# Patient Record
Sex: Male | Born: 1955 | Race: White | Hispanic: No | Marital: Married | Smoking: Never smoker
Health system: Southern US, Community
[De-identification: ages and names within clinical notes are randomized; demographics above are authoritative.]

## PROBLEM LIST (undated history)

## (undated) DIAGNOSIS — Z87442 Personal history of urinary calculi: Secondary | ICD-10-CM

## (undated) DIAGNOSIS — K4090 Unilateral inguinal hernia, without obstruction or gangrene, not specified as recurrent: Secondary | ICD-10-CM

## (undated) DIAGNOSIS — Z973 Presence of spectacles and contact lenses: Secondary | ICD-10-CM

## (undated) DIAGNOSIS — I10 Essential (primary) hypertension: Secondary | ICD-10-CM

## (undated) DIAGNOSIS — E785 Hyperlipidemia, unspecified: Secondary | ICD-10-CM

## (undated) DIAGNOSIS — N200 Calculus of kidney: Secondary | ICD-10-CM

## (undated) DIAGNOSIS — K429 Umbilical hernia without obstruction or gangrene: Secondary | ICD-10-CM

## (undated) HISTORY — PX: POLYPECTOMY: SHX149

## (undated) HISTORY — PX: HERNIA REPAIR: SHX51

## (undated) HISTORY — PX: CHOLECYSTECTOMY: SHX55

## (undated) HISTORY — DX: Hyperlipidemia, unspecified: E78.5

## (undated) HISTORY — PX: COLONOSCOPY: SHX174

## (undated) HISTORY — PX: LITHOTRIPSY: SUR834

## (undated) HISTORY — DX: Essential (primary) hypertension: I10

## (undated) HISTORY — PX: CYSTOSCOPY: SUR368

---

## 1998-11-06 ENCOUNTER — Encounter: Payer: Self-pay | Admitting: Urology

## 1998-11-06 ENCOUNTER — Ambulatory Visit (HOSPITAL_COMMUNITY): Admission: RE | Admit: 1998-11-06 | Discharge: 1998-11-06 | Payer: Self-pay | Admitting: Urology

## 1999-07-08 ENCOUNTER — Encounter: Payer: Self-pay | Admitting: Urology

## 1999-07-08 ENCOUNTER — Ambulatory Visit (HOSPITAL_COMMUNITY): Admission: RE | Admit: 1999-07-08 | Discharge: 1999-07-08 | Payer: Self-pay | Admitting: Urology

## 2000-11-29 ENCOUNTER — Encounter: Payer: Self-pay | Admitting: Urology

## 2000-11-29 ENCOUNTER — Encounter: Admission: RE | Admit: 2000-11-29 | Discharge: 2000-11-29 | Payer: Self-pay | Admitting: Urology

## 2000-12-08 ENCOUNTER — Encounter: Admission: RE | Admit: 2000-12-08 | Discharge: 2000-12-08 | Payer: Self-pay | Admitting: Urology

## 2000-12-08 ENCOUNTER — Encounter: Payer: Self-pay | Admitting: Urology

## 2000-12-21 ENCOUNTER — Encounter: Payer: Self-pay | Admitting: Urology

## 2000-12-21 ENCOUNTER — Encounter: Admission: RE | Admit: 2000-12-21 | Discharge: 2000-12-21 | Payer: Self-pay | Admitting: Urology

## 2004-10-29 ENCOUNTER — Encounter: Payer: Self-pay | Admitting: Cardiology

## 2004-10-29 ENCOUNTER — Emergency Department (HOSPITAL_COMMUNITY): Admission: EM | Admit: 2004-10-29 | Discharge: 2004-10-29 | Payer: Self-pay

## 2007-05-29 ENCOUNTER — Encounter: Admission: RE | Admit: 2007-05-29 | Discharge: 2007-05-29 | Payer: Self-pay | Admitting: Urology

## 2007-06-14 ENCOUNTER — Ambulatory Visit (HOSPITAL_COMMUNITY): Admission: RE | Admit: 2007-06-14 | Discharge: 2007-06-14 | Payer: Self-pay | Admitting: Urology

## 2007-10-23 ENCOUNTER — Ambulatory Visit: Payer: Self-pay | Admitting: Gastroenterology

## 2007-11-05 ENCOUNTER — Ambulatory Visit: Payer: Self-pay | Admitting: Gastroenterology

## 2007-11-05 ENCOUNTER — Encounter: Payer: Self-pay | Admitting: Gastroenterology

## 2009-11-20 ENCOUNTER — Encounter: Admission: RE | Admit: 2009-11-20 | Discharge: 2009-11-20 | Payer: Self-pay | Admitting: Specialist

## 2010-11-04 ENCOUNTER — Encounter: Payer: Self-pay | Admitting: Gastroenterology

## 2010-11-18 NOTE — Letter (Signed)
Summary: Colonoscopy Date Change Letter  [facility]  [address]   [place], [place] [address]   Phone: [phone]  Fax: [phone]      November 04, 2010 MRN: [id number]   [address] [name], [place]  [address]   Dear [name],   Previously you were recommended to have a repeat colonoscopy around this time. Your chart was recently reviewed by Dr. Jarold Motto of [facility]. Follow up colonoscopy is now recommended in January 2014. This revised recommendation is based on current, nationally recognized guidelines for colorectal cancer screening and polyp surveillance. These guidelines are endorsed by the American Cancer Society, [employer] on Colorectal Cancer as well as numerous other major medical organizations.  Please understand that our recommendation assumes that you do not have any new symptoms such as bleeding, a change in bowel habits, anemia, or significant abdominal discomfort. If you do have any concerning GI symptoms or want to discuss the guideline recommendations, please call to arrange an office visit at your earliest convenience. Otherwise we will keep you in our reminder system and contact you 1-2 months prior to the date listed above to schedule your next colonoscopy.  Thank you,   Vania Rea. Jarold Motto, M.D.  [facility] [phone]

## 2011-03-04 NOTE — H&P (Signed)
NAME:  Austin Lane, Austin Lane NO.:  [id number]   MEDICAL RECORD NO.:  [id number]          PATIENT TYPE:  EMS   LOCATION:  MAJO                         FACILITY:  [facility]   PHYSICIAN:  Peter M. Swaziland, M.D.  DATE OF BIRTH:  1956/05/19   DATE OF ADMISSION:  10/29/2004  DATE OF DISCHARGE:                                HISTORY & PHYSICAL   HISTORY OF PRESENT ILLNESS:  [name] is a pleasant 55 year old white male  who presents to the emergency room for an evaluation of chest pain.  Pain  has been present for 24 hours.  It is a constant pain.  It is localized  beneath his left pectoral region.  It is described as a sharp knife-like  sensation and is worse with deep breathing or certain movement.  There is no  radiation of the pain.  He has had no nausea, vomiting, shortness of breath  or diaphoresis.  The pain seemed to intensify earlier today and he presented  to the emergency room.  He has no known history of cardiac disease.  He has  no known cardiac risk factors.   PAST MEDICAL HISTORY:  His past medical history is significant for recurrent  kidney stones.  He has had multiple lithotripsy procedures.  Her has a  history of prior cholecystectomy and previous hernia repair.  He has no  known history of diabetes, hypertension or hypercholesterolemia.   CURRENT MEDICATIONS:  His current medications include hydrochlorothiazide  daily and Urocit daily, both for kidney stones.   ALLERGIES:  He has no known allergies.   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  He is married.  He has 2 children.  He denies alcohol or tobacco use.   FAMILY HISTORY:  He has a brother who has a history of dilated nonischemic  cardiomyopathy and has undergone cardiac transplant surgery.  Otherwise, his  family is negative.   PHYSICAL EXAM:  GENERAL:  The patient is a young white male in no apparent  distress.  VITAL SIGNS:  His blood pressure is 125/64, his  pulse is 77 and regular, his  respirations are 16, he is afebrile, SATS are 98% on room air.  HEENT:  He is normocephalic, atraumatic.  Pupils are equal, round and  reactive to light and accommodation.  Extraocular movements are full.  Oropharynx is clear.  NECK:  Neck is supple without JVD, adenopathy, thyromegaly or bruits.  LUNGS:  Lungs are clear to auscultation and percussion.  CARDIAC:  Exam reveals a regular rate and rhythm with normal S1 and S2,  without gallop, murmur, rub or click.  There is no localized tenderness to  palpation of the chest wall.  ABDOMEN:  The abdomen is soft and nontender, without masses or bruits.  EXTREMITIES:  Femoral and pedal pulses are 2+ and symmetric.  NEUROLOGIC:  Exam is nonfocal.   LABORATORY DATA:  Chest x-ray shows no active disease.  Heart size is  normal.   ECG shows normal sinus rhythm.  There is T wave inversion in V4 through V6  which is new compared to prior ECG in 2000.   Chemistry panel:  Sodium is 138, potassium 3.5, chloride 102, CO2 31, BUN  13, creatinine 1.0, glucose 121. Total bilirubin is 1.9; all other liver  function studies are normal.  CBC is normal.  Pro time is normal.  Troponin  is less than 0.05.  CK-MB is less than 1 and myoglobin is normal.   IMPRESSION:  1.  Atypical chest pain most consistent with a pleuritic-type pain.  2.  T-wave inversion noted on ECG.  3.  History of kidney stones.  4.  Family history of dilated cardiomyopathy.   PLAN:  We will obtain echocardiogram today.  If his echo is normal, then we  will plan on discharge today with therapy with ibuprofen.  We will arrange a  followup stress Cardiolite study as an outpatient.       PMJ/MEDQ  D:  10/29/2004  T:  10/29/2004  Job:  [id number]   cc:   Evelena Peat, M.D.  [address]  [place] [address]  Fax: [phone]

## 2011-07-29 LAB — BASIC METABOLIC PANEL
BUN: 11
Chloride: 106
Glucose, Bld: 99
Potassium: 4.1
Sodium: 140

## 2012-09-21 ENCOUNTER — Encounter: Payer: Self-pay | Admitting: Gastroenterology

## 2012-10-03 ENCOUNTER — Encounter: Payer: Self-pay | Admitting: Gastroenterology

## 2012-11-18 ENCOUNTER — Emergency Department (HOSPITAL_COMMUNITY)
Admission: EM | Admit: 2012-11-18 | Discharge: 2012-11-19 | Disposition: A | Discharge status: Home / Self Care | Payer: 59 | Attending: Emergency Medicine | Admitting: Emergency Medicine

## 2012-11-18 ENCOUNTER — Encounter (HOSPITAL_COMMUNITY): Payer: Self-pay | Admitting: Emergency Medicine

## 2012-11-18 DIAGNOSIS — A498 Other bacterial infections of unspecified site: Secondary | ICD-10-CM | POA: Insufficient documentation

## 2012-11-18 DIAGNOSIS — Z79899 Other long term (current) drug therapy: Secondary | ICD-10-CM | POA: Insufficient documentation

## 2012-11-18 DIAGNOSIS — N23 Unspecified renal colic: Secondary | ICD-10-CM

## 2012-11-18 DIAGNOSIS — B952 Enterococcus as the cause of diseases classified elsewhere: Secondary | ICD-10-CM | POA: Insufficient documentation

## 2012-11-18 DIAGNOSIS — Z87442 Personal history of urinary calculi: Secondary | ICD-10-CM | POA: Insufficient documentation

## 2012-11-18 DIAGNOSIS — Z7982 Long term (current) use of aspirin: Secondary | ICD-10-CM | POA: Insufficient documentation

## 2012-11-18 DIAGNOSIS — N201 Calculus of ureter: Secondary | ICD-10-CM | POA: Insufficient documentation

## 2012-11-18 HISTORY — DX: Calculus of kidney: N20.0

## 2012-11-18 NOTE — ED Notes (Signed)
Pt c/o RLQ pain, onset suddenly at 2000 tonight, + nausea, vomiting, diarrhea.

## 2012-11-19 ENCOUNTER — Encounter (HOSPITAL_COMMUNITY): Payer: Self-pay | Admitting: Emergency Medicine

## 2012-11-19 ENCOUNTER — Emergency Department (HOSPITAL_COMMUNITY): Payer: 59

## 2012-11-19 LAB — URINALYSIS, ROUTINE W REFLEX MICROSCOPIC
Glucose, UA: NEGATIVE mg/dL
Ketones, ur: 15 mg/dL — AB
Specific Gravity, Urine: 1.019 (ref 1.005–1.030)
pH: 8 (ref 5.0–8.0)

## 2012-11-19 LAB — URINE MICROSCOPIC-ADD ON

## 2012-11-19 MED ORDER — SODIUM CHLORIDE 0.9 % IV SOLN
INTRAVENOUS | Status: DC
  Administered 2012-11-19: 1000 mL via INTRAVENOUS

## 2012-11-19 MED ORDER — MORPHINE SULFATE 4 MG/ML IJ SOLN
4.0000 mg | INTRAMUSCULAR | Status: DC | PRN
  Administered 2012-11-19: 4 mg via INTRAVENOUS
  Filled 2012-11-19: qty 1

## 2012-11-19 MED ORDER — OXYCODONE-ACETAMINOPHEN 5-325 MG PO TABS
2.0000 | ORAL_TABLET | Freq: Once | ORAL | Status: AC
  Administered 2012-11-19: 2 via ORAL
  Filled 2012-11-19: qty 2

## 2012-11-19 MED ORDER — FENTANYL CITRATE 0.05 MG/ML IJ SOLN
50.0000 ug | Freq: Once | INTRAMUSCULAR | Status: AC
  Administered 2012-11-19: 50 ug via INTRAVENOUS
  Filled 2012-11-19: qty 2

## 2012-11-19 MED ORDER — ONDANSETRON 8 MG PO TBDP
8.0000 mg | ORAL_TABLET | Freq: Once | ORAL | Status: AC
  Administered 2012-11-19: 8 mg via ORAL
  Filled 2012-11-19: qty 1

## 2012-11-19 MED ORDER — ONDANSETRON HCL 4 MG/2ML IJ SOLN
4.0000 mg | INTRAMUSCULAR | Status: DC | PRN
  Administered 2012-11-19: 4 mg via INTRAVENOUS
  Filled 2012-11-19: qty 2

## 2012-11-19 MED ORDER — ONDANSETRON 4 MG PO TBDP: 4.0000 mg | ORAL_TABLET | Freq: Three times a day (TID) | ORAL | Status: DC | PRN

## 2012-11-19 MED ORDER — TAMSULOSIN HCL 0.4 MG PO CAPS: 0.4000 mg | ORAL_CAPSULE | Freq: Every day | ORAL | Status: DC

## 2012-11-19 MED ORDER — OXYCODONE-ACETAMINOPHEN 5-325 MG PO TABS: ORAL_TABLET | ORAL | Status: DC

## 2012-11-19 NOTE — ED Provider Notes (Signed)
History     CSN: [id number]  Arrival date & time 11/18/12  2313   First MD Initiated Contact with Patient 11/19/12 0013      Chief Complaint  Patient presents with  . Abdominal Pain  . Flank Pain     HPI Pt was seen at 0050.  Per pt, c/o sudden onset and persistence of constant right sided flank "pain" that began last evening approx 2000 PTA.  Pt describes the pain as "like my last kidney stone," and radiating into the right side of his abd. Describes his right sided lower back and abd pain as "sore." Has been associated with multiple intermittent episodes of N/V/D and hematuria.  Denies testicular pain/swelling, no dysuria, no black or blood in emesis, no CP/SOB.     Past Medical History  Diagnosis Date  . Kidney stone     Past Surgical History  Procedure Date  . Hernia repair   . Cholecystectomy      History  Substance Use Topics  . Smoking status: Never Smoker   . Smokeless tobacco: Not on file  . Alcohol Use: Yes     Comment: occasional      Review of Systems ROS: Statement: All systems negative except as marked or noted in the HPI; Constitutional: Negative for fever and chills. ; ; Eyes: Negative for eye pain, redness and discharge. ; ; ENMT: Negative for ear pain, hoarseness, nasal congestion, sinus pressure and sore throat. ; ; Cardiovascular: Negative for chest pain, palpitations, diaphoresis, dyspnea and peripheral edema. ; ; Respiratory: Negative for cough, wheezing and stridor. ; ; Gastrointestinal: +N/V/D. Negative for blood in stool, hematemesis, jaundice and rectal bleeding. . ; ; Genitourinary: Negative for dysuria, +flank pain and hematuria. ; ; Genital:  No penile drainage or rash, no testicular pain or swelling, no scrotal rash or swelling.;; Musculoskeletal: Negative for back pain and neck pain. Negative for swelling and trauma.; ; Skin: Negative for pruritus, rash, abrasions, blisters, bruising and skin lesion.; ; Neuro: Negative for headache,  lightheadedness and neck stiffness. Negative for weakness, altered level of consciousness , altered mental status, extremity weakness, paresthesias, involuntary movement, seizure and syncope.     Allergies  Review of patient's allergies indicates no known allergies.  Home Medications   Current Outpatient Rx  Name  Route  Sig  Dispense  Refill  . ASPIRIN EC 81 MG PO TBEC   Oral   Take 81 mg by mouth daily.         Marland Kitchen GABAPENTIN 600 MG PO TABS   Oral   Take 600 mg by mouth 3 (three) times daily. headaches         . OLMESARTAN MEDOXOMIL-HCTZ 40-25 MG PO TABS   Oral   Take 1 tablet by mouth daily.         . OMEGA-3-ACID ETHYL ESTERS 1 G PO CAPS   Oral   Take 1 g by mouth 2 (two) times daily. OTC         . POTASSIUM CITRATE ER 10 MEQ (1080 MG) PO TBCR   Oral   Take 10 mEq by mouth 3 (three) times daily with meals. 540mg            BP 122/83  Pulse 69  Temp 98.5 F (36.9 C) (Oral)  Resp 20  Ht 6' (1.829 m)  Wt 200 lb (90.719 kg)  BMI 27.12 kg/m2  SpO2 99%  Physical Exam 0055: Physical examination:  Nursing notes reviewed; Vital signs and O2 SAT  reviewed;  Constitutional: Well developed, Well nourished, Well hydrated, Uncomfortable appearing; Head:  Normocephalic, atraumatic; Eyes: EOMI, PERRL, No scleral icterus; ENMT: Mouth and pharynx normal, Mucous membranes moist; Neck: Supple, Full range of motion, No lymphadenopathy; Cardiovascular: Regular rate and rhythm, No gallop; Respiratory: Breath sounds clear & equal bilaterally, No rales, rhonchi, wheezes.  Speaking full sentences with ease, Normal respiratory effort/excursion; Chest: Nontender, Movement normal; Abdomen: Soft, Nontender, Nondistended, Normal bowel sounds; Genitourinary: No CVA tenderness; Spine:  No midline CS, TS, LS tenderness.  +TTP right lumbar paraspinal muscles;; Extremities: Pulses normal, No tenderness, No edema, No calf edema or asymmetry.; Neuro: AA&Ox3, Major CN grossly intact.  Speech clear. No  gross focal motor or sensory deficits in extremities.; Skin: Color normal, Warm, Dry.   ED Course  Procedures    MDM  MDM Reviewed: previous chart, nursing note and vitals Interpretation: labs and CT scan   Results for orders placed during the hospital encounter of 11/18/12  URINALYSIS, ROUTINE W REFLEX MICROSCOPIC      Component Value Range   Color, Urine RED (*) YELLOW   APPearance TURBID (*) CLEAR   Specific Gravity, Urine 1.019  1.005 - 1.030   pH 8.0  5.0 - 8.0   Glucose, UA NEGATIVE  NEGATIVE mg/dL   Hgb urine dipstick LARGE (*) NEGATIVE   Bilirubin Urine NEGATIVE  NEGATIVE   Ketones, ur 15 (*) NEGATIVE mg/dL   Protein, ur 30 (*) NEGATIVE mg/dL   Urobilinogen, UA 0.2  0.0 - 1.0 mg/dL   Nitrite NEGATIVE  NEGATIVE   Leukocytes, UA SMALL (*) NEGATIVE  URINE MICROSCOPIC-ADD ON      Component Value Range   Squamous Epithelial / LPF RARE  RARE   WBC, UA 0-2  <3 WBC/hpf   RBC / HPF TOO NUMEROUS TO COUNT  <3 RBC/hpf   Bacteria, UA FEW (*) RARE   Casts HYALINE CASTS (*) NEGATIVE   Ct Abdomen Pelvis Wo Contrast 11/19/2012  *RADIOLOGY REPORT*  Clinical Data: Right flank pain, nausea, vomiting, and diarrhea.  CT ABDOMEN AND PELVIS WITHOUT CONTRAST  Technique:  Multidetector CT imaging of the abdomen and pelvis was performed following the standard protocol without intravenous contrast.  Comparison: 05/29/2007  Findings: Atelectasis in the lung bases.  Small esophageal hiatal hernia.  5 mm stone in the distal right ureter at the level of the lower sacrum there is proximal ureterectasis and pyelocaliectasis with mild periureteral stranding.  Changes are consistent with moderate obstruction. Multiple tiny punctate sized intrarenal stones on the right.  There are multiple stones in the mid and lower pole of the left kidney, largest measuring about 4 mm.  There is no evidence of ureteral stone or obstruction on the left.  There is a small low attenuation lesion in the lower pole of the left  kidney which may represent a cyst, but it is measuring about 14 mm today compared with 7 mm previously.  Ultrasound or contrast enhanced CT is recommend to confirm cystic nature of this lesion.  The unenhanced appearance of the liver, spleen, pancreas, adrenal glands, abdominal aorta, and retroperitoneal lymph nodes is unremarkable.  Surgical absence of the gallbladder.  No bile duct dilatation.  The stomach, small bowel, and colon are not abnormally distended.  No free air or free fluid in the abdomen.  Fat in the umbilicus.  Pelvis:  Enlarged prostate gland measuring 5 x 3.9 cm.  No bladder wall thickening.  No free or loculated pelvic fluid collections. No significant pelvic lymphadenopathy.  No evidence of diverticulitis.  The appendix is normal.  Normal alignment of the lumbar vertebrae.  IMPRESSION: 5 mm stone in the distal right ureter with moderate obstruction. Multiple nonobstructing intrarenal stones. Indeterminate low attenuation lesion in the lower pole of the left kidney may represent a cyst but is enlarging since previous study.  Ultrasound or contrast enhanced CT is recommended confirm cystic nature of this lesion.  Small esophageal hiatal hernia.   Original Report Authenticated By: Burman Nieves, M.D.      0300:  Feels better now and wants to go home.  No clear UTI on Udip, UC pending.  Dx and testing d/w pt and family.  Questions answered.  Verb understanding, agreeable to d/c home with outpt f/u with his Uro MD this week.           Laray Anger, DO 11/21/12 [phone]

## 2012-11-22 ENCOUNTER — Encounter: Payer: Self-pay | Admitting: Gastroenterology

## 2012-11-22 ENCOUNTER — Ambulatory Visit (AMBULATORY_SURGERY_CENTER): Payer: 59 | Admitting: *Deleted

## 2012-11-22 VITALS — Ht 72.0 in | Wt 204.2 lb

## 2012-11-22 DIAGNOSIS — Z1211 Encounter for screening for malignant neoplasm of colon: Secondary | ICD-10-CM

## 2012-11-22 LAB — URINE CULTURE

## 2012-11-22 MED ORDER — MOVIPREP 100 G PO SOLR: ORAL | Status: DC

## 2012-11-23 NOTE — ED Notes (Signed)
+   Urine Chart sent to EDP office for review. 

## 2012-11-26 NOTE — ED Notes (Addendum)
Chart returned from ED.Rx written for Kelfex 500 tid TID x 10 days QTY 30 tabs   Written by Vedia Pereyra

## 2012-11-28 ENCOUNTER — Telehealth (HOSPITAL_COMMUNITY): Payer: Self-pay | Admitting: Emergency Medicine

## 2012-11-28 NOTE — ED Notes (Addendum)
Pt returning call.  Pt informed of Dx and need for addl tx.  Rx for Keflex called to [facility] [phone] and given to pharmacist.  Results to be faxed to [facility] Attn Dr Normajean Baxter per pt request

## 2012-12-01 ENCOUNTER — Other Ambulatory Visit: Payer: Self-pay

## 2012-12-03 ENCOUNTER — Ambulatory Visit (AMBULATORY_SURGERY_CENTER): Payer: 59 | Admitting: Gastroenterology

## 2012-12-03 ENCOUNTER — Encounter: Payer: Self-pay | Admitting: Gastroenterology

## 2012-12-03 VITALS — BP 107/75 | HR 56 | Temp 96.0°F | Resp 54 | Ht 72.0 in | Wt 204.0 lb

## 2012-12-03 DIAGNOSIS — D126 Benign neoplasm of colon, unspecified: Secondary | ICD-10-CM

## 2012-12-03 DIAGNOSIS — Z1211 Encounter for screening for malignant neoplasm of colon: Secondary | ICD-10-CM

## 2012-12-03 MED ORDER — SODIUM CHLORIDE 0.9 % IV SOLN: 500.0000 mL | INTRAVENOUS | Status: DC

## 2012-12-03 NOTE — Op Note (Signed)
[facility] [address] [place], [address]   COLONOSCOPY PROCEDURE REPORT  PATIENT: Austin Lane, Austin Lane  MR#: [id number] BIRTHDATE: [birth year] , 56  yrs. old GENDER: Male ENDOSCOPIST: Mardella Layman, MD, [facility] REFERRED BY: PROCEDURE DATE:  12/03/2012 PROCEDURE:   Colonoscopy with biopsy and snare polypectomy ASA CLASS:   Class II INDICATIONS:Patient's personal history of colon polyps and cecal adenoma removed 5y ago. MEDICATIONS: propofol (Diprivan) 200mg  IV  DESCRIPTION OF PROCEDURE:   After the risks and benefits and of the procedure were explained, informed consent was obtained.  A digital rectal exam revealed no abnormalities of the rectum.    The LB CF-[id number]  endoscope was introduced through the anus and advanced to the cecum, which was identified by both the appendix and ileocecal valve .  The quality of the prep was excellent, using MoviPrep .  The instrument was then slowly withdrawn as the colon was fully examined.     COLON FINDINGS: A small smooth flat polyp was found in the rectum. A polypectomy was performed with a cold snare.  The resection was complete and the polyp tissue was completely retrieved.   The colon was otherwise normal.  There was no diverticulosis, inflammation, polyps or cancers unless previously stated.     Retroflexed views revealed no abnormalities.     The scope was then withdrawn from the patient and the procedure completed.  COMPLICATIONS: There were no complications. ENDOSCOPIC IMPRESSION: 1.   Small flat polyp was found in the rectum; polypectomy was performed with a cold snare 2.   The colon was otherwise normal  RECOMMENDATIONS: 1.  Await biopsy results 2.  Repeat Colonoscopy in 5 years.   REPEAT EXAM:  [id number] Doristine Counter, MD  _______________________________ eSigned:  Mardella Layman, MD, [facility] 12/03/2012 8:49 AM

## 2012-12-03 NOTE — Patient Instructions (Addendum)
YOU HAD AN ENDOSCOPIC PROCEDURE TODAY AT THE [facility]: Refer to the procedure report that was given to you for any specific questions about what was found during the examination.  If the procedure report does not answer your questions, please call your gastroenterologist to clarify.  If you requested that your care partner not be given the details of your procedure findings, then the procedure report has been included in a sealed envelope for you to review at your convenience later.  YOU SHOULD EXPECT: Some feelings of bloating in the abdomen. Passage of more gas than usual.  Walking can help get rid of the air that was put into your GI tract during the procedure and reduce the bloating. If you had a lower endoscopy (such as a colonoscopy or flexible sigmoidoscopy) you may notice spotting of blood in your stool or on the toilet paper. If you underwent a bowel prep for your procedure, then you may not have a normal bowel movement for a few days.  DIET: Your first meal following the procedure should be a light meal and then it is ok to progress to your normal diet.  A half-sandwich or bowl of soup is an example of a good first meal.  Heavy or fried foods are harder to digest and may make you feel nauseous or bloated.  Likewise meals heavy in dairy and vegetables can cause extra gas to form and this can also increase the bloating.  Drink plenty of fluids but you should avoid alcoholic beverages for 24 hours.  ACTIVITY: Your care partner should take you home directly after the procedure.  You should plan to take it easy, moving slowly for the rest of the day.  You can resume normal activity the day after the procedure however you should NOT DRIVE or use heavy machinery for 24 hours (because of the sedation medicines used during the test).    SYMPTOMS TO REPORT IMMEDIATELY: A gastroenterologist can be reached at any hour.  During normal business hours, 8:30 AM to 5:00 PM Monday through Friday,  call [phone].  After hours and on weekends, please call the GI answering service at [phone] who will take a message and have the physician on call contact you.   Following lower endoscopy (colonoscopy or flexible sigmoidoscopy):  Excessive amounts of blood in the stool  Significant tenderness or worsening of abdominal pains  Swelling of the abdomen that is new, acute  Fever of 100F or higher    FOLLOW UP: If any biopsies were taken you will be contacted by phone or by letter within the next 1-3 weeks.  Call your gastroenterologist if you have not heard about the biopsies in 3 weeks.  Our staff will call the home number listed on your records the next business day following your procedure to check on you and address any questions or concerns that you may have at that time regarding the information given to you following your procedure. This is a courtesy call and so if there is no answer at the home number and we have not heard from you through the emergency physician on call, we will assume that you have returned to your regular daily activities without incident.  SIGNATURES/CONFIDENTIALITY: You and/or your care partner have signed paperwork which will be entered into your electronic medical record.  These signatures attest to the fact that that the information above on your After Visit Summary has been reviewed and is understood.  Full responsibility of the confidentiality   of this discharge information lies with you and/or your care-partner.  Polyp and hemorhoid information given.  Repeat colonoscopy in 5 years-2019

## 2012-12-03 NOTE — Progress Notes (Signed)
Called to room to assist during endoscopic procedure.  Patient ID and intended procedure confirmed with present staff. Received instructions for my participation in the procedure from the performing physician.  

## 2012-12-03 NOTE — Progress Notes (Signed)
No egg or soy allergy. No problems with sedation. ewm

## 2012-12-03 NOTE — Progress Notes (Signed)
Patient did not experience any of the following events: a burn prior to discharge; a fall within the facility; wrong site/side/patient/procedure/implant event; or a hospital transfer or hospital admission upon discharge from the facility. (G8907) Patient did not have preoperative order for IV antibiotic SSI prophylaxis. (G8918)  

## 2012-12-04 ENCOUNTER — Telehealth: Payer: Self-pay | Admitting: *Deleted

## 2012-12-04 NOTE — Telephone Encounter (Signed)
  Follow up Call-  Call back number 12/03/2012  Post procedure Call Back phone  # [phone]  Permission to leave phone message Yes     Patient questions:  Do you have a fever, pain , or abdominal swelling? no Pain Score  0 *  Have you tolerated food without any problems? yes  Have you been able to return to your normal activities? yes  Do you have any questions about your discharge instructions: Diet   no Medications  no Follow up visit  no  Do you have questions or concerns about your Care? no  Actions: * If pain score is 4 or above: No action needed, pain <4.

## 2012-12-07 ENCOUNTER — Encounter: Payer: Self-pay | Admitting: Gastroenterology

## 2013-08-22 ENCOUNTER — Other Ambulatory Visit: Payer: Self-pay

## 2014-06-06 IMAGING — CT CT ABD-PELV W/O CM
1 series · 14 of 20 positions shown, 19 images · non-contrast
Comparison: 05/29/2007

CLINICAL DATA: Right flank pain, nausea, vomiting, and diarrhea.

CT ABDOMEN AND PELVIS WITHOUT CONTRAST
TECHNIQUE: Multidetector CT imaging of the abdomen and pelvis was
performed following the standard protocol without intravenous
contrast.

[Series 3: lung · axial · 0.74mm/px · z∈[+1542,+1626]mm · 14 of 20 slices shown, 19 images]
[im 2/20  soft-tissue]
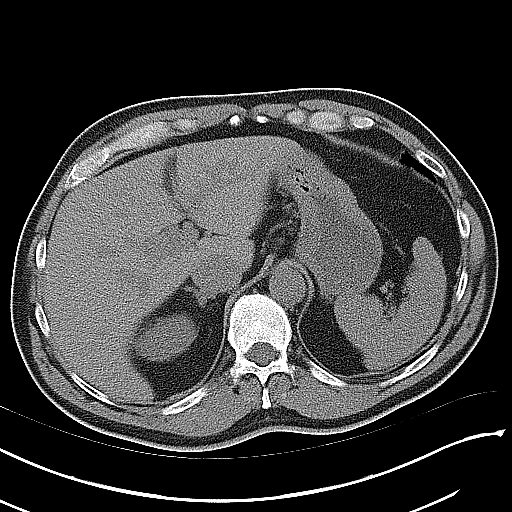
[im 2/20  bone]
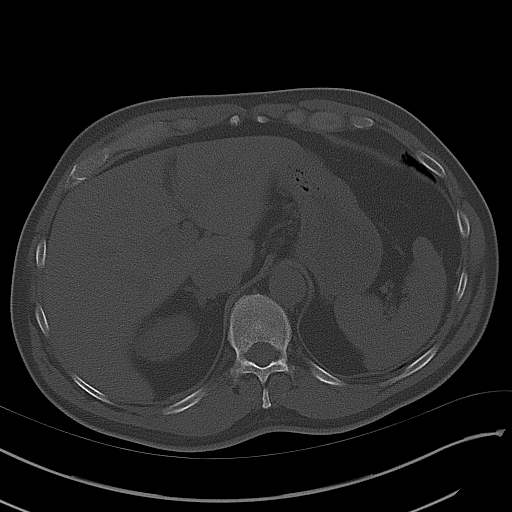
[im 4/20  soft-tissue]
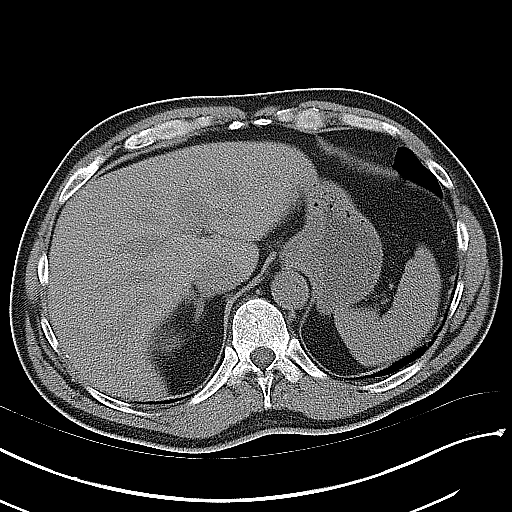
[im 5/20  soft-tissue]
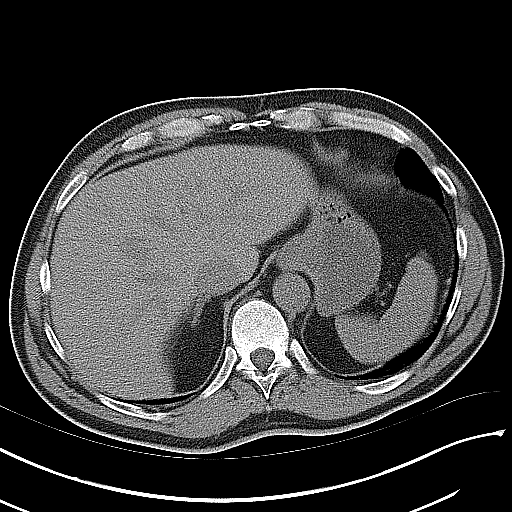
[im 6/20  soft-tissue]
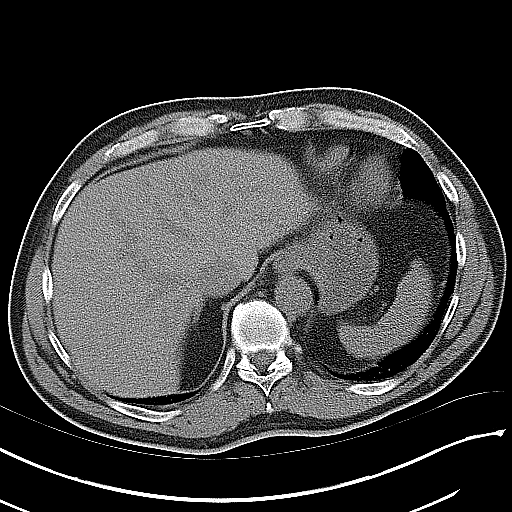
[im 8/20  soft-tissue]
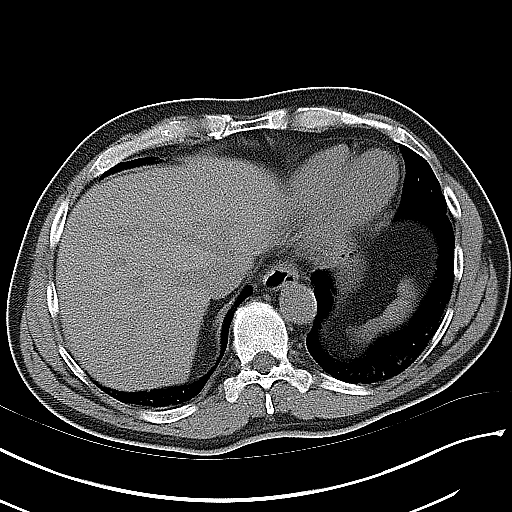
[im 9/20  soft-tissue]
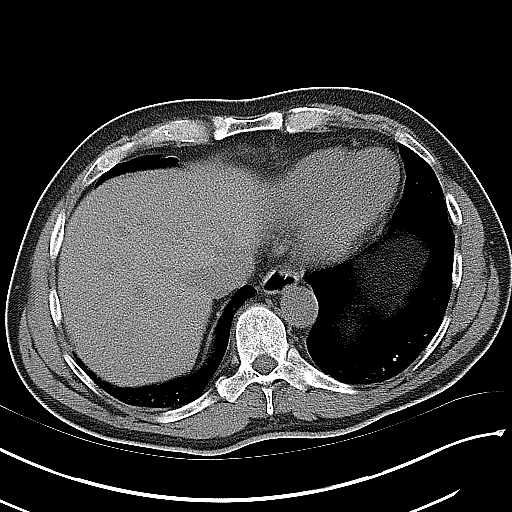
[im 11/20  soft-tissue]
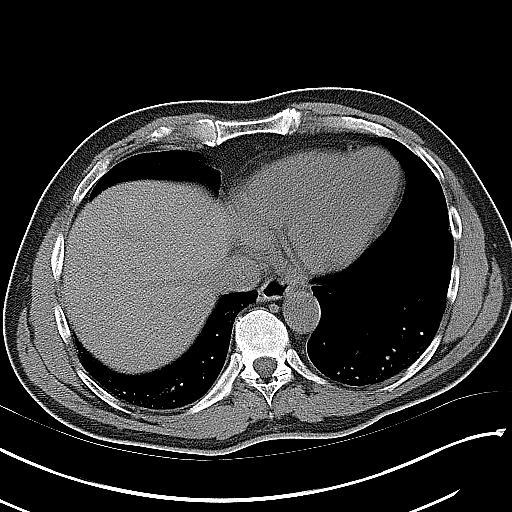
[im 12/20  soft-tissue]
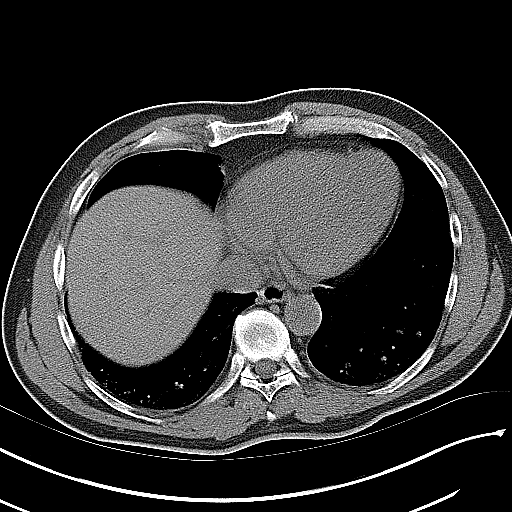
[im 13/20  soft-tissue]
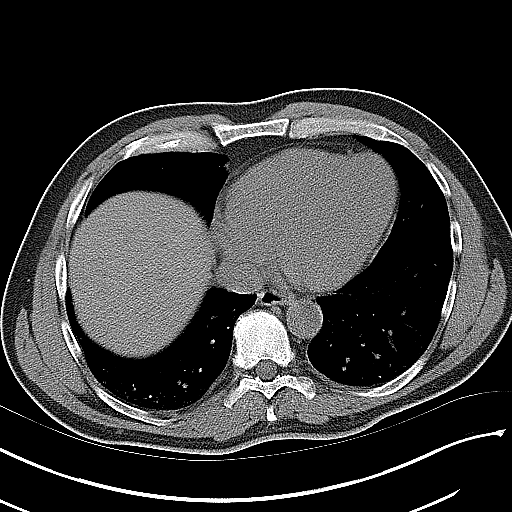
[im 13/20  bone]
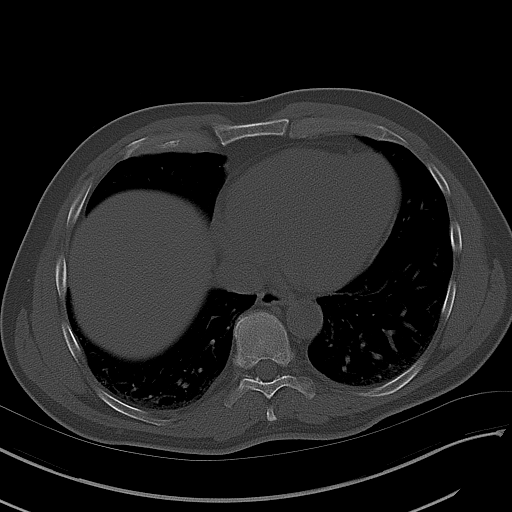
[im 15/20  soft-tissue]
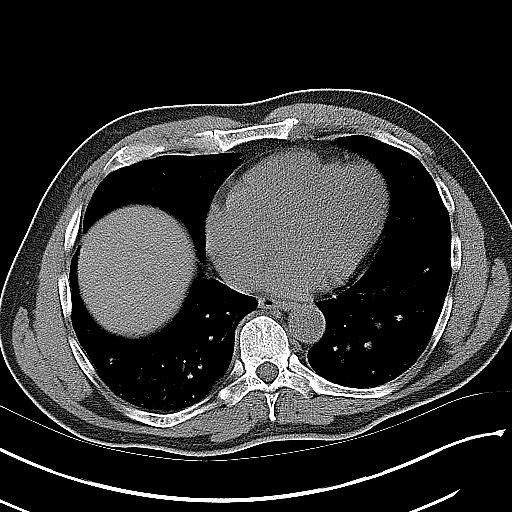
[im 16/20  soft-tissue]
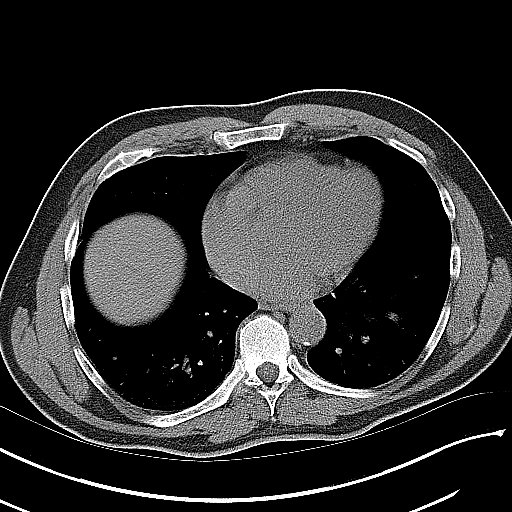
[im 16/20  lung]
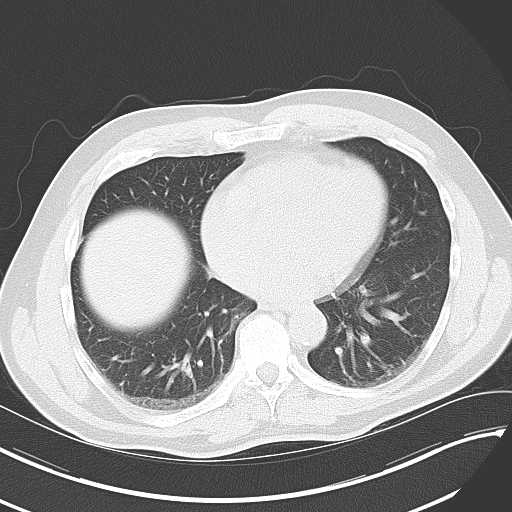
[im 17/20  soft-tissue]
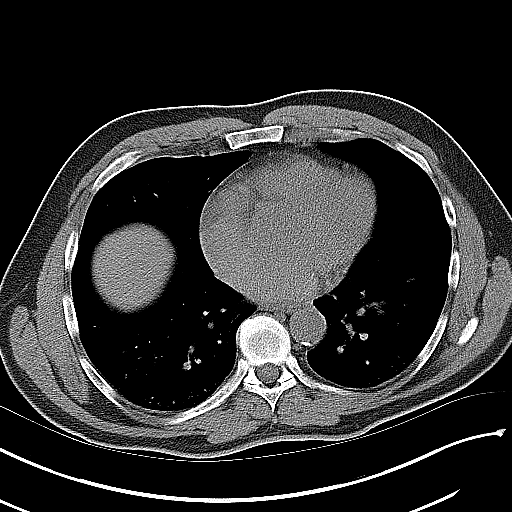
[im 17/20  lung]
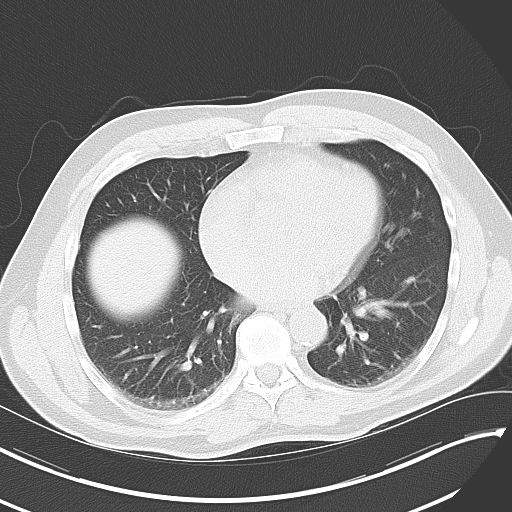
[im 18/20  lung]
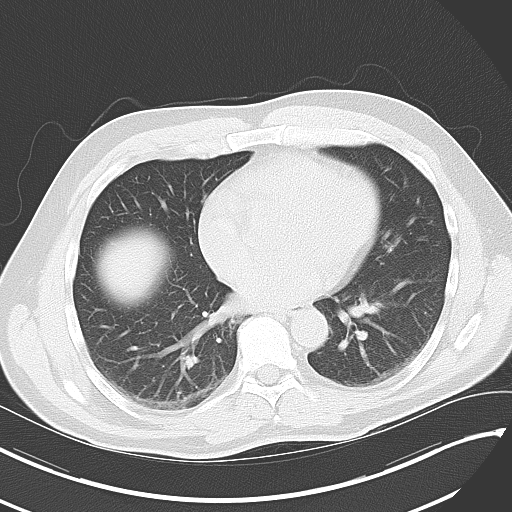
[im 19/20  soft-tissue]
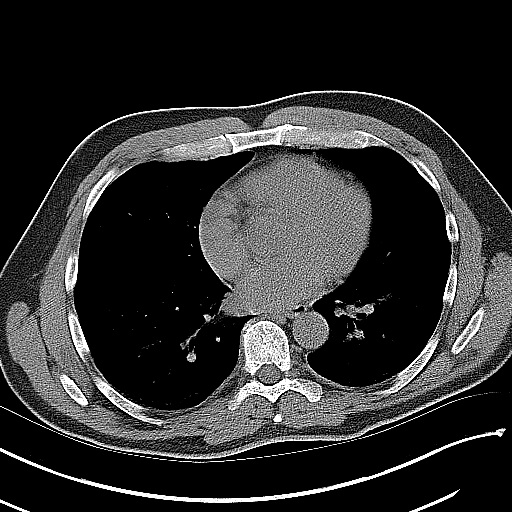
[im 19/20  lung]
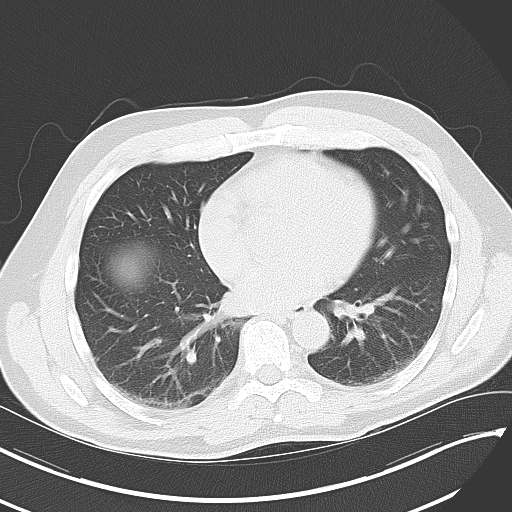

[14 of 20 positions shown; findings below may reference images not displayed]

FINDINGS: Atelectasis in the lung bases.  Small esophageal hiatal
hernia.

5 mm stone in the distal right ureter at the level of the lower
sacrum there is proximal ureterectasis and pyelocaliectasis with
mild periureteral stranding.  Changes are consistent with moderate
obstruction. Multiple tiny punctate sized intrarenal stones on the
right.  There are multiple stones in the mid and lower pole of the
left kidney, largest measuring about 4 mm.  There is no evidence of
ureteral stone or obstruction on the left.  There is a small low
attenuation lesion in the lower pole of the left kidney which may
represent a cyst, but it is measuring about 14 mm today compared
with 7 mm previously.  Ultrasound or contrast enhanced CT is
recommend to confirm cystic nature of this lesion.

The unenhanced appearance of the liver, spleen, pancreas, adrenal
glands, abdominal aorta, and retroperitoneal lymph nodes is
unremarkable.  Surgical absence of the gallbladder.  No bile duct
dilatation.  The stomach, small bowel, and colon are not abnormally
distended.  No free air or free fluid in the abdomen.  Fat in the
umbilicus.

Pelvis:  Enlarged prostate gland measuring 5 x 3.9 cm.  No bladder
wall thickening.  No free or loculated pelvic fluid collections.
No significant pelvic lymphadenopathy.  No evidence of
diverticulitis.  The appendix is normal.  Normal alignment of the
lumbar vertebrae.
IMPRESSION: 5 mm stone in the distal right ureter with moderate obstruction.
Multiple nonobstructing intrarenal stones. Indeterminate low
attenuation lesion in the lower pole of the left kidney may
represent a cyst but is enlarging since previous study.  Ultrasound
or contrast enhanced CT is recommended confirm cystic nature of
this lesion.  Small esophageal hiatal hernia.

## 2015-12-17 DIAGNOSIS — G8929 Other chronic pain: Secondary | ICD-10-CM | POA: Insufficient documentation

## 2015-12-17 DIAGNOSIS — I10 Essential (primary) hypertension: Secondary | ICD-10-CM | POA: Insufficient documentation

## 2015-12-17 DIAGNOSIS — F4329 Adjustment disorder with other symptoms: Secondary | ICD-10-CM | POA: Insufficient documentation

## 2015-12-17 DIAGNOSIS — R519 Headache, unspecified: Secondary | ICD-10-CM | POA: Insufficient documentation

## 2015-12-17 DIAGNOSIS — R319 Hematuria, unspecified: Secondary | ICD-10-CM | POA: Insufficient documentation

## 2017-01-03 DIAGNOSIS — I10 Essential (primary) hypertension: Secondary | ICD-10-CM | POA: Diagnosis not present

## 2017-01-03 DIAGNOSIS — R51 Headache: Secondary | ICD-10-CM | POA: Diagnosis not present

## 2017-01-03 DIAGNOSIS — N200 Calculus of kidney: Secondary | ICD-10-CM | POA: Diagnosis not present

## 2017-01-04 DIAGNOSIS — H04123 Dry eye syndrome of bilateral lacrimal glands: Secondary | ICD-10-CM | POA: Diagnosis not present

## 2017-01-04 DIAGNOSIS — H01001 Unspecified blepharitis right upper eyelid: Secondary | ICD-10-CM | POA: Diagnosis not present

## 2017-01-04 DIAGNOSIS — H35412 Lattice degeneration of retina, left eye: Secondary | ICD-10-CM | POA: Diagnosis not present

## 2017-02-22 DIAGNOSIS — R51 Headache: Secondary | ICD-10-CM | POA: Diagnosis not present

## 2017-02-22 DIAGNOSIS — G44229 Chronic tension-type headache, not intractable: Secondary | ICD-10-CM | POA: Diagnosis not present

## 2017-02-22 DIAGNOSIS — G43019 Migraine without aura, intractable, without status migrainosus: Secondary | ICD-10-CM | POA: Diagnosis not present

## 2017-03-09 DIAGNOSIS — Z13220 Encounter for screening for lipoid disorders: Secondary | ICD-10-CM | POA: Diagnosis not present

## 2017-03-09 DIAGNOSIS — Z0000 Encounter for general adult medical examination without abnormal findings: Secondary | ICD-10-CM | POA: Diagnosis not present

## 2017-05-11 DIAGNOSIS — Z23 Encounter for immunization: Secondary | ICD-10-CM | POA: Diagnosis not present

## 2017-09-05 DIAGNOSIS — G43019 Migraine without aura, intractable, without status migrainosus: Secondary | ICD-10-CM | POA: Diagnosis not present

## 2017-09-05 DIAGNOSIS — R51 Headache: Secondary | ICD-10-CM | POA: Diagnosis not present

## 2017-09-05 DIAGNOSIS — G44229 Chronic tension-type headache, not intractable: Secondary | ICD-10-CM | POA: Diagnosis not present

## 2017-12-15 DIAGNOSIS — Z87442 Personal history of urinary calculi: Secondary | ICD-10-CM | POA: Diagnosis not present

## 2017-12-15 DIAGNOSIS — I10 Essential (primary) hypertension: Secondary | ICD-10-CM | POA: Diagnosis not present

## 2017-12-23 ENCOUNTER — Encounter: Payer: Self-pay | Admitting: Gastroenterology

## 2018-01-05 DIAGNOSIS — H5213 Myopia, bilateral: Secondary | ICD-10-CM | POA: Diagnosis not present

## 2018-01-08 ENCOUNTER — Encounter: Payer: Self-pay | Admitting: Gastroenterology

## 2018-02-15 DIAGNOSIS — L578 Other skin changes due to chronic exposure to nonionizing radiation: Secondary | ICD-10-CM | POA: Diagnosis not present

## 2018-02-15 DIAGNOSIS — L570 Actinic keratosis: Secondary | ICD-10-CM | POA: Diagnosis not present

## 2018-02-20 ENCOUNTER — Other Ambulatory Visit: Payer: Self-pay

## 2018-02-20 ENCOUNTER — Encounter: Payer: Self-pay | Admitting: Gastroenterology

## 2018-02-20 ENCOUNTER — Ambulatory Visit (AMBULATORY_SURGERY_CENTER): Payer: Self-pay | Admitting: *Deleted

## 2018-02-20 VITALS — Ht 72.0 in | Wt 209.6 lb

## 2018-02-20 DIAGNOSIS — Z86010 Personal history of colonic polyps: Secondary | ICD-10-CM

## 2018-02-20 MED ORDER — NA SULFATE-K SULFATE-MG SULF 17.5-3.13-1.6 GM/177ML PO SOLN: 1.0000 [IU] | Freq: Once | ORAL | 0 refills | Status: AC

## 2018-02-20 NOTE — Progress Notes (Signed)
No egg or soy allergy known to patient  No issues with past sedation with any surgeries  or procedures, no intubation problems  No diet pills per patient No home 02 use per patient  No blood thinners per patient  Pt denies issues with constipation  No A fib or A flutter  EMMI video sent to pt's e mail  

## 2018-03-05 ENCOUNTER — Encounter: Payer: Self-pay | Admitting: Gastroenterology

## 2018-03-05 ENCOUNTER — Ambulatory Visit (AMBULATORY_SURGERY_CENTER): Payer: 59 | Admitting: Gastroenterology

## 2018-03-05 ENCOUNTER — Other Ambulatory Visit: Payer: Self-pay

## 2018-03-05 VITALS — BP 111/79 | HR 62 | Temp 98.2°F | Resp 15 | Ht 72.0 in | Wt 209.0 lb

## 2018-03-05 DIAGNOSIS — D120 Benign neoplasm of cecum: Secondary | ICD-10-CM

## 2018-03-05 DIAGNOSIS — D123 Benign neoplasm of transverse colon: Secondary | ICD-10-CM

## 2018-03-05 DIAGNOSIS — Z86010 Personal history of colonic polyps: Secondary | ICD-10-CM | POA: Diagnosis present

## 2018-03-05 DIAGNOSIS — K635 Polyp of colon: Secondary | ICD-10-CM

## 2018-03-05 DIAGNOSIS — Z1211 Encounter for screening for malignant neoplasm of colon: Secondary | ICD-10-CM | POA: Diagnosis not present

## 2018-03-05 DIAGNOSIS — D126 Benign neoplasm of colon, unspecified: Secondary | ICD-10-CM

## 2018-03-05 DIAGNOSIS — D127 Benign neoplasm of rectosigmoid junction: Secondary | ICD-10-CM

## 2018-03-05 MED ORDER — SODIUM CHLORIDE 0.9 % IV SOLN: 500.0000 mL | Freq: Once | INTRAVENOUS | Status: DC

## 2018-03-05 NOTE — Patient Instructions (Signed)
HANDOUTS GIVEN FOR POLYPS, HEMORRHOIDS AND DIVERTICULOSIS  YOU HAD AN ENDOSCOPIC PROCEDURE TODAY AT THE [facility]:   Refer to the procedure report that was given to you for any specific questions about what was found during the examination.  If the procedure report does not answer your questions, please call your gastroenterologist to clarify.  If you requested that your care partner not be given the details of your procedure findings, then the procedure report has been included in a sealed envelope for you to review at your convenience later.  YOU SHOULD EXPECT: Some feelings of bloating in the abdomen. Passage of more gas than usual.  Walking can help get rid of the air that was put into your GI tract during the procedure and reduce the bloating. If you had a lower endoscopy (such as a colonoscopy or flexible sigmoidoscopy) you may notice spotting of blood in your stool or on the toilet paper. If you underwent a bowel prep for your procedure, you may not have a normal bowel movement for a few days.  Please Note:  You might notice some irritation and congestion in your nose or some drainage.  This is from the oxygen used during your procedure.  There is no need for concern and it should clear up in a day or so.  SYMPTOMS TO REPORT IMMEDIATELY:   Following lower endoscopy (colonoscopy or flexible sigmoidoscopy):  Excessive amounts of blood in the stool  Significant tenderness or worsening of abdominal pains  Swelling of the abdomen that is new, acute  Fever of 100F or higher  For urgent or emergent issues, a gastroenterologist can be reached at any hour by calling [phone].   DIET:  We do recommend a small meal at first, but then you may proceed to your regular diet.  Drink plenty of fluids but you should avoid alcoholic beverages for 24 hours.  ACTIVITY:  You should plan to take it easy for the rest of today and you should NOT DRIVE or use heavy machinery until tomorrow  (because of the sedation medicines used during the test).    FOLLOW UP: Our staff will call the number listed on your records the next business day following your procedure to check on you and address any questions or concerns that you may have regarding the information given to you following your procedure. If we do not reach you, we will leave a message.  However, if you are feeling well and you are not experiencing any problems, there is no need to return our call.  We will assume that you have returned to your regular daily activities without incident.  If any biopsies were taken you will be contacted by phone or by letter within the next 1-3 weeks.  Please call us at [phone] if you have not heard about the biopsies in 3 weeks.    SIGNATURES/CONFIDENTIALITY: You and/or your care partner have signed paperwork which will be entered into your electronic medical record.  These signatures attest to the fact that that the information above on your After Visit Summary has been reviewed and is understood.  Full responsibility of the confidentiality of this discharge information lies with you and/or your care-partner.   

## 2018-03-05 NOTE — Op Note (Signed)
[facility] Patient Name: Austin Lane Procedure Date: 03/05/2018 7:57 AM MRN: [id number] Endoscopist: Remo Lipps P. Mackenzye Mackel MD, MD Age: 62 Referring MD:  Date of Birth: 02/11/1956 Gender: Male Account #: [id number] Procedure:                Colonoscopy Indications:              Surveillance: Personal history of adenomatous                            polyps on last colonoscopy 5 years ago Medicines:                Monitored Anesthesia Care Procedure:                Pre-Anesthesia Assessment:                           - Prior to the procedure, a History and Physical                            was performed, and patient medications and                            allergies were reviewed. The patient's tolerance of                            previous anesthesia was also reviewed. The risks                            and benefits of the procedure and the sedation                            options and risks were discussed with the patient.                            All questions were answered, and informed consent                            was obtained. Prior Anticoagulants: The patient has                            taken no previous anticoagulant or antiplatelet                            agents. ASA Grade Assessment: II - A patient with                            mild systemic disease. After reviewing the risks                            and benefits, the patient was deemed in                            satisfactory condition to undergo the procedure.  After obtaining informed consent, the colonoscope                            was passed under direct vision. Throughout the                            procedure, the patient's blood pressure, pulse, and                            oxygen saturations were monitored continuously. The                            Model CF-HQ190L [id number] scope was introduced                            through the anus and  advanced to the the cecum,                            identified by appendiceal orifice and ileocecal                            valve. The colonoscopy was performed without                            difficulty. The patient tolerated the procedure                            well. The quality of the bowel preparation was                            good. The ileocecal valve, appendiceal orifice, and                            rectum were photographed. Scope In: 8:01:27 AM Scope Out: 8:18:07 AM Scope Withdrawal Time: 0 hours 14 minutes 8 seconds  Total Procedure Duration: 0 hours 16 minutes 40 seconds  Findings:                 The perianal and digital rectal examinations were                            normal.                           A 3 mm polyp was found in the cecum. The polyp was                            sessile. The polyp was removed with a cold snare.                            Resection and retrieval were complete.                           A 3 mm polyp was found in the transverse colon. The  polyp was sessile. The polyp was removed with a                            cold snare. Resection and retrieval were complete.                           A 3 mm polyp was found in the recto-sigmoid colon.                            The polyp was sessile. The polyp was removed with a                            cold snare. Resection and retrieval were complete.                           A few medium-mouthed diverticula were found in the                            sigmoid colon.                           Internal hemorrhoids were found during retroflexion.                           The exam was otherwise without abnormality. Complications:            No immediate complications. Estimated blood loss:                            Minimal. Estimated Blood Loss:     Estimated blood loss was minimal. Impression:               - One 3 mm polyp in the cecum, removed with a cold                             snare. Resected and retrieved.                           - One 3 mm polyp in the transverse colon, removed                            with a cold snare. Resected and retrieved.                           - One 3 mm polyp at the recto-sigmoid colon,                            removed with a cold snare. Resected and retrieved.                           - Diverticulosis in the sigmoid colon.                           - Internal hemorrhoids.                           -  The examination was otherwise normal. Recommendation:           - Patient has a contact number available for                            emergencies. The signs and symptoms of potential                            delayed complications were discussed with the                            patient. Return to normal activities tomorrow.                            Written discharge instructions were provided to the                            patient.                           - Resume previous diet.                           - Continue present medications.                           - Await pathology results.                           - Repeat colonoscopy for surveillance based on                            pathology results. Remo Lipps P. Linah Klapper MD, MD 03/05/2018 8:21:55 AM This report has been signed electronically.

## 2018-03-05 NOTE — Progress Notes (Signed)
No changes in medical or surgical hx since PV per pt 

## 2018-03-05 NOTE — Progress Notes (Signed)
To PACU, VSS. Report to [email or web address] 

## 2018-03-05 NOTE — Progress Notes (Signed)
Called to room to assist during endoscopic procedure.  Patient ID and intended procedure confirmed with present staff. Received instructions for my participation in the procedure from the performing physician.  

## 2018-03-06 ENCOUNTER — Telehealth: Payer: Self-pay | Admitting: *Deleted

## 2018-03-06 ENCOUNTER — Telehealth: Payer: Self-pay

## 2018-03-06 NOTE — Telephone Encounter (Signed)
  Follow up Call-  Call back number 03/05/2018  Post procedure Call Back phone  # [phone]  Permission to leave phone message Yes  Some recent data might be hidden     Patient questions:  Do you have a fever, pain , or abdominal swelling? No. Pain Score  0 *  Have you tolerated food without any problems? Yes.    Have you been able to return to your normal activities? Yes.    Do you have any questions about your discharge instructions: Diet   No. Medications  No. Follow up visit  No.  Do you have questions or concerns about your Care? No.  Actions: * If pain score is 4 or above: No action needed, pain <4.

## 2018-03-06 NOTE — Telephone Encounter (Signed)
No answer for post procedure call back. Left message and will attempt to call back later this afternoon. SM 

## 2018-03-07 DIAGNOSIS — R51 Headache: Secondary | ICD-10-CM | POA: Diagnosis not present

## 2018-03-07 DIAGNOSIS — G44229 Chronic tension-type headache, not intractable: Secondary | ICD-10-CM | POA: Diagnosis not present

## 2018-03-07 DIAGNOSIS — G43019 Migraine without aura, intractable, without status migrainosus: Secondary | ICD-10-CM | POA: Diagnosis not present

## 2018-03-09 ENCOUNTER — Encounter: Payer: Self-pay | Admitting: Gastroenterology

## 2018-03-14 DIAGNOSIS — Z0000 Encounter for general adult medical examination without abnormal findings: Secondary | ICD-10-CM | POA: Diagnosis not present

## 2018-03-14 DIAGNOSIS — Z13220 Encounter for screening for lipoid disorders: Secondary | ICD-10-CM | POA: Diagnosis not present

## 2018-03-14 DIAGNOSIS — Z1159 Encounter for screening for other viral diseases: Secondary | ICD-10-CM | POA: Diagnosis not present

## 2018-03-22 DIAGNOSIS — L570 Actinic keratosis: Secondary | ICD-10-CM | POA: Diagnosis not present

## 2018-09-03 DIAGNOSIS — G43019 Migraine without aura, intractable, without status migrainosus: Secondary | ICD-10-CM | POA: Diagnosis not present

## 2018-09-03 DIAGNOSIS — R51 Headache: Secondary | ICD-10-CM | POA: Diagnosis not present

## 2018-09-03 DIAGNOSIS — G44229 Chronic tension-type headache, not intractable: Secondary | ICD-10-CM | POA: Diagnosis not present

## 2018-09-06 DIAGNOSIS — L570 Actinic keratosis: Secondary | ICD-10-CM | POA: Diagnosis not present

## 2018-10-18 DIAGNOSIS — L570 Actinic keratosis: Secondary | ICD-10-CM | POA: Diagnosis not present

## 2018-11-22 DIAGNOSIS — L570 Actinic keratosis: Secondary | ICD-10-CM | POA: Diagnosis not present

## 2019-02-18 DIAGNOSIS — L570 Actinic keratosis: Secondary | ICD-10-CM | POA: Diagnosis not present

## 2020-09-28 ENCOUNTER — Ambulatory Visit: Payer: Self-pay | Admitting: Surgery

## 2020-09-28 NOTE — H&P (Signed)
Austin Lane Appointment: 09/28/2020 3:15 PM Location: [facility] Patient #: [id number] DOB: 11/14/1955 Married / Language: Austin Lane / Race: White Male  History of Present Illness Austin Hector MD; 09/28/2020 5:29 PM) The patient is a 64 year old male who presents with an inguinal hernia. Note for "Inguinal hernia": ` ` ` Patient sent for surgical consultation at the request of Dr Marjory Lies Marrow  Chief Complaint: Right inguinal hernia. ` ` The patient is an active male. History of left inguinal hernia repaired in the 1990s with mesh. Developed some bulging discomfort after doing some yardwork. Suspected right inguinal hernia. Discussed with primary care physician. Dr. Orland Mustard recommended surgical evaluation. Patient had an open inguinal hernia repair on the left side in the early 1990s. He thinks Dr. Deon Pilling. Has not any symptoms or problems there. He notes an obvious hernia on the right side. He had cholecystectomy done laparoscopically in the late 1990s. No other abdominal surgeries. He is moderately physically active and walk several miles without difficulty. Usually [name] once a morning. No diabetes. No smoking or sleep apnea. Usually can go on a treadmill for several miles. Goes to the gym about 3 times a week. Is on some gabapentin for some history of kidney stones and pain.  (Review of systems as stated in this history (HPI) or in the review of systems. Otherwise all other 12 point ROS are negative) ` ` ###########################################`  This patient encounter took 30 minutes today to perform the following: obtain history, perform exam, review outside records, interpret tests & imaging, counsel the patient on their diagnosis; and, document this encounter, including findings & plan in the electronic health record (EHR).   Past Surgical History Emeline Gins, [place]; 09/28/2020 2:59 PM) Colon Polyp Removal - Colonoscopy Gallbladder  Surgery - Laparoscopic Open Inguinal Hernia Surgery Left. Oral Surgery  Diagnostic Studies History Emeline Gins, [place]; 09/28/2020 2:59 PM) Colonoscopy 1-5 years ago  Allergies Emeline Gins, [place]; 09/28/2020 2:59 PM) No Known Drug Allergies [09/28/2020]: Allergies Reconciled  Medication History Emeline Gins, CMA; 09/28/2020 3:00 PM) Olmesartan Medoxomil-HCTZ (40-25MG  Tablet, Oral) Active. Potassium Citrate ER (5 MEQ(540 MG) Tablet ER, Oral) Active. Gabapentin (100MG  Capsule, Oral) Active. Omega 3 (1000MG  Capsule, Oral) Active. Medications Reconciled  Social History Emeline Gins, [place]; 09/28/2020 2:59 PM) Alcohol use Occasional alcohol use. Caffeine use Carbonated beverages, Coffee. No drug use Tobacco use Never smoker.  Family History Emeline Gins, [place]; 09/28/2020 2:59 PM) Depression Mother. Heart Disease Brother. Heart disease in male family member before age 80 Hypertension Father, Mother. Prostate Cancer Father.  Other Problems Emeline Gins, [place]; 09/28/2020 2:59 PM) Cholelithiasis High blood pressure Inguinal Hernia Kidney Stone     Review of Systems Emeline Gins CMA; 09/28/2020 2:59 PM) General Not Present- Appetite Loss, Chills, Fatigue, Fever, Night Sweats, Weight Gain and Weight Loss. Skin Not Present- Change in Wart/Mole, Dryness, Hives, Jaundice, New Lesions, Non-Healing Wounds, Rash and Ulcer. HEENT Present- Wears glasses/contact lenses. Not Present- Earache, Hearing Loss, Hoarseness, Nose Bleed, Oral Ulcers, Ringing in the Ears, Seasonal Allergies, Sinus Pain, Sore Throat, Visual Disturbances and Yellow Eyes. Respiratory Not Present- Bloody sputum, Chronic Cough, Difficulty Breathing, Snoring and Wheezing. Breast Not Present- Breast Mass, Breast Pain, Nipple Discharge and Skin Changes. Cardiovascular Not Present- Chest Pain, Difficulty Breathing Lying Down, Leg Cramps, Palpitations, Rapid Heart Rate, Shortness of  Breath and Swelling of Extremities. Gastrointestinal Not Present- Abdominal Pain, Bloating, Bloody Stool, Change in Bowel Habits, Chronic diarrhea, Constipation, Difficulty Swallowing, Excessive gas, Gets full quickly at meals,  Hemorrhoids, Indigestion, Nausea, Rectal Pain and Vomiting. Male Genitourinary Not Present- Blood in Urine, Change in Urinary Stream, Frequency, Impotence, Nocturia, Painful Urination, Urgency and Urine Leakage. Musculoskeletal Not Present- Back Pain, Joint Pain, Joint Stiffness, Muscle Pain, Muscle Weakness and Swelling of Extremities. Neurological Not Present- Decreased Memory, Fainting, Headaches, Numbness, Seizures, Tingling, Tremor, Trouble walking and Weakness. Psychiatric Not Present- Anxiety, Bipolar, Change in Sleep Pattern, Depression, Fearful and Frequent crying. Endocrine Not Present- Cold Intolerance, Excessive Hunger, Hair Changes, Heat Intolerance, Hot flashes and New Diabetes. Hematology Not Present- Blood Thinners, Easy Bruising, Excessive bleeding, Gland problems, HIV and Persistent Infections.  Vitals Emeline Gins CMA; 09/28/2020 2:59 PM) 09/28/2020 2:59 PM Weight: 213 lb Height: 71in Body Surface Area: 2.17 m Body Mass Index: 29.71 kg/m  Temp.: 25F  Pulse: 89 (Regular)  BP: 142/84(Sitting, Left Arm, Standard)        Physical Exam Austin Hector MD; 09/28/2020 5:30 PM)  General Mental Status-Alert. General Appearance-Not in acute distress, Not Sickly. Orientation-Oriented X3. Hydration-Well hydrated. Voice-Normal.  Integumentary Global Assessment Upon inspection and palpation of skin surfaces of the - Axillae: non-tender, no inflammation or ulceration, no drainage. and Distribution of scalp and body hair is normal. General Characteristics Temperature - normal warmth is noted.  Head and Neck Head-normocephalic, atraumatic with no lesions or palpable masses. Face Global Assessment - atraumatic, no  absence of expression. Neck Global Assessment - no abnormal movements, no bruit auscultated on the right, no bruit auscultated on the left, no decreased range of motion, non-tender. Trachea-midline. Thyroid Gland Characteristics - non-tender.  Eye Eyeball - Left-Extraocular movements intact, No Nystagmus - Left. Eyeball - Right-Extraocular movements intact, No Nystagmus - Right. Cornea - Left-No Hazy - Left. Cornea - Right-No Hazy - Right. Sclera/Conjunctiva - Left-No scleral icterus, No Discharge - Left. Sclera/Conjunctiva - Right-No scleral icterus, No Discharge - Right. Pupil - Left-Direct reaction to light normal. Pupil - Right-Direct reaction to light normal.  ENMT Ears Pinna - Left - no drainage observed, no generalized tenderness observed. Pinna - Right - no drainage observed, no generalized tenderness observed. Nose and Sinuses External Inspection of the Nose - no destructive lesion observed. Inspection of the nares - Left - quiet respiration. Inspection of the nares - Right - quiet respiration. Mouth and Throat Lips - Upper Lip - no fissures observed, no pallor noted. Lower Lip - no fissures observed, no pallor noted. Nasopharynx - no discharge present. Oral Cavity/Oropharynx - Tongue - no dryness observed. Oral Mucosa - no cyanosis observed. Hypopharynx - no evidence of airway distress observed.  Chest and Lung Exam Inspection Movements - Normal and Symmetrical. Accessory muscles - No use of accessory muscles in breathing. Palpation Palpation of the chest reveals - Non-tender. Auscultation Breath sounds - Normal and Clear.  Cardiovascular Auscultation Rhythm - Regular. Murmurs & Other Heart Sounds - Auscultation of the heart reveals - No Murmurs and No Systolic Clicks.  Abdomen Inspection Inspection of the abdomen reveals - No Visible peristalsis and No Abnormal pulsations. Umbilicus - No Bleeding, No Urine drainage. Palpation/Percussion Palpation  and Percussion of the abdomen reveal - Soft, Non Tender, No Rebound tenderness, No Rigidity (guarding) and No Cutaneous hyperesthesia. Note: Abdomen soft. Nontender. Not distended. Small umbilical hernia through the stalk only. No guarding.  Male Genitourinary Sexual Maturity Tanner 5 - Adult hair pattern and Adult penile size and shape. Note: ########################################################   Obvious right inguinal hernia. Mildly sensitive but reducible. No definite left inguinal recurrence. Otherwise normal external male genitalia  Peripheral Vascular  Upper Extremity Inspection - Left - No Cyanotic nailbeds - Left, Not Ischemic. Inspection - Right - No Cyanotic nailbeds - Right, Not Ischemic.  Neurologic Neurologic evaluation reveals -normal attention span and ability to concentrate, able to name objects and repeat phrases. Appropriate fund of knowledge , normal sensation and normal coordination. Mental Status Affect - not angry, not paranoid. Cranial Nerves-Normal Bilaterally. Gait-Normal.  Neuropsychiatric Mental status exam performed with findings of-able to articulate well with normal speech/language, rate, volume and coherence, thought content normal with ability to perform basic computations and apply abstract reasoning and no evidence of hallucinations, delusions, obsessions or homicidal/suicidal ideation.  Musculoskeletal Global Assessment Spine, Ribs and Pelvis - no instability, subluxation or laxity. Right Upper Extremity - no instability, subluxation or laxity.  Lymphatic Head & Neck  General Head & Neck Lymphatics: Bilateral - Description - No Localized lymphadenopathy. Axillary  General Axillary Region: Bilateral - Description - No Localized lymphadenopathy. Femoral & Inguinal  Generalized Femoral & Inguinal Lymphatics: Left - Description - No Localized lymphadenopathy. Right - Description - No Localized lymphadenopathy.    Assessment  & Plan Austin Hector MD; 09/28/2020 5:28 PM)  RIGHT INGUINAL HERNIA (K40.90) Impression: Obvious right inguinal hernia reducible. No strong evidence of any recurrence on the left side but will to my due diligence to disprove any new hernias on that side  I think he would benefit from hernia repair. Laparoscopic approach. He likes the idea of doing a laparoscopic approach. We will work to coordinate a convenient time   [facility] SUR[facility] PROCEDURE (Z01.818)  Current Plans You are being scheduled for surgery- Our schedulers will call you.  You should hear from our office's scheduling department within 5 working days about the location, date, and time of surgery. We try to make accommodations for patient's preferences in scheduling surgery, but sometimes the OR schedule or the surgeon's schedule prevents [place] from making those accommodations.  If you have not heard from our office [phone]) in 5 working days, call the office and ask for your surgeon's nurse.  If you have other questions about your diagnosis, plan, or surgery, call the office and ask for your surgeon's nurse.  Written instructions provided The anatomy & physiology of the abdominal wall and pelvic floor was discussed. The pathophysiology of hernias in the inguinal and pelvic region was discussed. Natural history risks such as progressive enlargement, pain, incarceration, and strangulation was discussed. Contributors to complications such as smoking, obesity, diabetes, prior surgery, etc were discussed.  I feel the risks of no intervention will lead to serious problems that outweigh the operative risks; therefore, I recommended surgery to reduce and repair the hernia. I explained laparoscopic techniques with possible need for an open approach. I noted usual use of mesh to patch and/or buttress hernia repair  Risks such as bleeding, infection, abscess,  need for further treatment, heart attack, death, and other risks were discussed. I noted a good likelihood this will help address the problem. Goals of post-operative recovery were discussed as well. Possibility that this will not correct all symptoms was explained. I stressed the importance of low-impact activity, aggressive pain control, avoiding constipation, & not pushing through pain to minimize risk of post-operative chronic pain or injury. Possibility of reherniation was discussed. We will work to minimize complications.  An educational handout further explaining the pathology & treatment options was given as well. Questions were answered. The patient expresses understanding & wishes to proceed with  surgery.  Pt Education - Pamphlet Given - Laparoscopic Hernia Repair: discussed with patient and provided information. Pt Education - [facility] Pain Control (Eliah Ozawa) Pt Education - [facility] Hernia Post-Op HCI (Thaily Hackworth): discussed with patient and provided information. Pt Education - [facility] Mesh education: discussed with patient and provided information.  UMBILICAL HERNIA WITHOUT OBSTRUCTION AND WITHOUT GANGRENE (K42.9) Impression: Small umbilical hernia within the stalk. Most likely can resolve with primary repair  Austin Hector, MD, FACS, MASCRS Gastrointestinal and Minimally Invasive Surgery  [facility] [address] [place], [place] [address] [phone] Fax [phone] Main/Paging  CONTACT INFORMATION: Weekday (9AM-5PM) concerns: Call [facility] main office at [phone] Weeknight (5PM-9AM) or Weekend/Holiday concerns: Check [email or web address] for General Surgery [facility] coverage (Please, do not use SecureChat as it is not reliable communication to operating surgeons for immediate patient care)

## 2020-11-23 ENCOUNTER — Other Ambulatory Visit (HOSPITAL_COMMUNITY)
Admission: RE | Admit: 2020-11-23 | Discharge: 2020-11-23 | Disposition: A | Discharge status: Home / Self Care | Payer: 59 | Source: Ambulatory Visit | Attending: Surgery | Admitting: Surgery

## 2020-11-23 DIAGNOSIS — Z20822 Contact with and (suspected) exposure to covid-19: Secondary | ICD-10-CM | POA: Insufficient documentation

## 2020-11-23 DIAGNOSIS — Z01812 Encounter for preprocedural laboratory examination: Secondary | ICD-10-CM | POA: Insufficient documentation

## 2020-11-23 LAB — SARS CORONAVIRUS 2 (TAT 6-24 HRS): SARS Coronavirus 2: NEGATIVE

## 2020-11-24 ENCOUNTER — Encounter (HOSPITAL_BASED_OUTPATIENT_CLINIC_OR_DEPARTMENT_OTHER): Payer: Self-pay | Admitting: Surgery

## 2020-11-24 ENCOUNTER — Other Ambulatory Visit: Payer: Self-pay

## 2020-11-24 NOTE — Progress Notes (Signed)
Spoke w/ via phone for pre-op interview---pt Lab needs dos----    I stat ekg           Lab results------none COVID test ------11-23-2020 result negative in epic Arrive at -------530 am 11-26-2020 NPO after MN NO Solid Food.  Clear liquids from MN until---430 am drink ensure pre surgery drink at 430 am then npo Medications to take morning of surgery -----none Diabetic medication -----n/a Patient Special Instructions -----none Pre-Op special Istructions -----none Patient verbalized understanding of instructions that were given at this phone interview. Patient denies shortness of breath, chest pain, fever, cough at this phone interview.

## 2020-11-26 ENCOUNTER — Encounter (HOSPITAL_BASED_OUTPATIENT_CLINIC_OR_DEPARTMENT_OTHER)
Admission: RE | Disposition: A | Discharge status: Home / Self Care | Payer: Self-pay | Source: Home / Self Care | Attending: Surgery

## 2020-11-26 ENCOUNTER — Encounter (HOSPITAL_BASED_OUTPATIENT_CLINIC_OR_DEPARTMENT_OTHER): Payer: Self-pay | Admitting: Surgery

## 2020-11-26 ENCOUNTER — Ambulatory Visit (HOSPITAL_BASED_OUTPATIENT_CLINIC_OR_DEPARTMENT_OTHER)
Admission: RE | Admit: 2020-11-26 | Discharge: 2020-11-26 | Disposition: A | Discharge status: Home / Self Care | Payer: 59 | Attending: Surgery | Admitting: Surgery

## 2020-11-26 ENCOUNTER — Ambulatory Visit (HOSPITAL_BASED_OUTPATIENT_CLINIC_OR_DEPARTMENT_OTHER): Payer: 59 | Admitting: Anesthesiology

## 2020-11-26 DIAGNOSIS — K4120 Bilateral femoral hernia, without obstruction or gangrene, not specified as recurrent: Secondary | ICD-10-CM

## 2020-11-26 DIAGNOSIS — Z79899 Other long term (current) drug therapy: Secondary | ICD-10-CM | POA: Diagnosis not present

## 2020-11-26 DIAGNOSIS — Z23 Encounter for immunization: Secondary | ICD-10-CM | POA: Insufficient documentation

## 2020-11-26 DIAGNOSIS — K429 Umbilical hernia without obstruction or gangrene: Secondary | ICD-10-CM | POA: Diagnosis not present

## 2020-11-26 DIAGNOSIS — K4190 Unilateral femoral hernia, without obstruction or gangrene, not specified as recurrent: Secondary | ICD-10-CM | POA: Insufficient documentation

## 2020-11-26 DIAGNOSIS — E785 Hyperlipidemia, unspecified: Secondary | ICD-10-CM | POA: Insufficient documentation

## 2020-11-26 DIAGNOSIS — K4090 Unilateral inguinal hernia, without obstruction or gangrene, not specified as recurrent: Secondary | ICD-10-CM

## 2020-11-26 DIAGNOSIS — K4130 Unilateral femoral hernia, with obstruction, without gangrene, not specified as recurrent: Secondary | ICD-10-CM | POA: Insufficient documentation

## 2020-11-26 DIAGNOSIS — Z87442 Personal history of urinary calculi: Secondary | ICD-10-CM | POA: Insufficient documentation

## 2020-11-26 HISTORY — DX: Presence of spectacles and contact lenses: Z97.3

## 2020-11-26 HISTORY — DX: Umbilical hernia without obstruction or gangrene: K42.9

## 2020-11-26 HISTORY — PX: INGUINAL HERNIA REPAIR: SHX194

## 2020-11-26 HISTORY — PX: UMBILICAL HERNIA REPAIR: SHX196

## 2020-11-26 HISTORY — DX: Personal history of urinary calculi: Z87.442

## 2020-11-26 HISTORY — DX: Unilateral inguinal hernia, without obstruction or gangrene, not specified as recurrent: K40.90

## 2020-11-26 LAB — POCT I-STAT, CHEM 8
BUN: 16 mg/dL (ref 8–23)
Calcium, Ion: 1.32 mmol/L (ref 1.15–1.40)
Chloride: 101 mmol/L (ref 98–111)
Creatinine, Ser: 0.8 mg/dL (ref 0.61–1.24)
Glucose, Bld: 121 mg/dL — ABNORMAL HIGH (ref 70–99)
HCT: 45 % (ref 39.0–52.0)
Hemoglobin: 15.3 g/dL (ref 13.0–17.0)
Potassium: 3.6 mmol/L (ref 3.5–5.1)
Sodium: 139 mmol/L (ref 135–145)
TCO2: 24 mmol/L (ref 22–32)

## 2020-11-26 SURGERY — REPAIR, HERNIA, INGUINAL, LAPAROSCOPIC
Anesthesia: General

## 2020-11-26 MED ORDER — ONDANSETRON HCL 4 MG/2ML IJ SOLN
INTRAMUSCULAR | Status: DC | PRN
  Administered 2020-11-26: 4 mg via INTRAVENOUS

## 2020-11-26 MED ORDER — SODIUM CHLORIDE 0.9 % IR SOLN
Status: DC | PRN
  Administered 2020-11-26: 500 mL

## 2020-11-26 MED ORDER — MIDAZOLAM HCL 2 MG/2ML IJ SOLN
INTRAMUSCULAR | Status: AC
  Filled 2020-11-26: qty 2

## 2020-11-26 MED ORDER — CEFAZOLIN SODIUM-DEXTROSE 2-4 GM/100ML-% IV SOLN
2.0000 g | INTRAVENOUS | Status: AC
  Administered 2020-11-26: 2 g via INTRAVENOUS

## 2020-11-26 MED ORDER — BUPIVACAINE LIPOSOME 1.3 % IJ SUSP: 20.0000 mL | Freq: Once | INTRAMUSCULAR | Status: DC

## 2020-11-26 MED ORDER — CELECOXIB 200 MG PO CAPS
ORAL_CAPSULE | ORAL | Status: AC
  Filled 2020-11-26: qty 1

## 2020-11-26 MED ORDER — BUPIVACAINE LIPOSOME 1.3 % IJ SUSP
INTRAMUSCULAR | Status: DC | PRN
  Administered 2020-11-26: 20 mL

## 2020-11-26 MED ORDER — CEFAZOLIN SODIUM-DEXTROSE 2-4 GM/100ML-% IV SOLN
INTRAVENOUS | Status: AC
  Filled 2020-11-26: qty 100

## 2020-11-26 MED ORDER — ACETAMINOPHEN 500 MG PO TABS
1000.0000 mg | ORAL_TABLET | ORAL | Status: AC
  Administered 2020-11-26: 1000 mg via ORAL

## 2020-11-26 MED ORDER — ROCURONIUM BROMIDE 100 MG/10ML IV SOLN
INTRAVENOUS | Status: DC | PRN
  Administered 2020-11-26: 70 mg via INTRAVENOUS
  Administered 2020-11-26: 20 mg via INTRAVENOUS
  Administered 2020-11-26: 10 mg via INTRAVENOUS

## 2020-11-26 MED ORDER — FENTANYL CITRATE (PF) 100 MCG/2ML IJ SOLN
25.0000 ug | INTRAMUSCULAR | Status: DC | PRN
Start: 2020-11-26 — End: 2020-11-26

## 2020-11-26 MED ORDER — DEXAMETHASONE SODIUM PHOSPHATE 4 MG/ML IJ SOLN
INTRAMUSCULAR | Status: DC | PRN
  Administered 2020-11-26: 10 mg via INTRAVENOUS

## 2020-11-26 MED ORDER — SUGAMMADEX SODIUM 200 MG/2ML IV SOLN
INTRAVENOUS | Status: DC | PRN
  Administered 2020-11-26: 200 mg via INTRAVENOUS

## 2020-11-26 MED ORDER — MIDAZOLAM HCL 5 MG/5ML IJ SOLN
INTRAMUSCULAR | Status: DC | PRN
  Administered 2020-11-26: 2 mg via INTRAVENOUS

## 2020-11-26 MED ORDER — GLYCOPYRROLATE PF 0.2 MG/ML IJ SOSY
PREFILLED_SYRINGE | INTRAMUSCULAR | Status: AC
  Filled 2020-11-26: qty 1

## 2020-11-26 MED ORDER — LACTATED RINGERS IV SOLN: INTRAVENOUS | Status: DC

## 2020-11-26 MED ORDER — BUPIVACAINE-EPINEPHRINE 0.25% -1:200000 IJ SOLN
INTRAMUSCULAR | Status: DC | PRN
  Administered 2020-11-26: 60 mL

## 2020-11-26 MED ORDER — GLYCOPYRROLATE 0.2 MG/ML IJ SOLN
INTRAMUSCULAR | Status: DC | PRN
  Administered 2020-11-26: .2 mg via INTRAVENOUS

## 2020-11-26 MED ORDER — PROPOFOL 10 MG/ML IV BOLUS
INTRAVENOUS | Status: DC | PRN
  Administered 2020-11-26: 170 mg via INTRAVENOUS

## 2020-11-26 MED ORDER — ENSURE PRE-SURGERY PO LIQD: 296.0000 mL | Freq: Once | ORAL | Status: DC

## 2020-11-26 MED ORDER — CHLORHEXIDINE GLUCONATE CLOTH 2 % EX PADS: 6.0000 | MEDICATED_PAD | Freq: Once | CUTANEOUS | Status: DC

## 2020-11-26 MED ORDER — FENTANYL CITRATE (PF) 100 MCG/2ML IJ SOLN
INTRAMUSCULAR | Status: AC
  Filled 2020-11-26: qty 2

## 2020-11-26 MED ORDER — LIDOCAINE HCL (CARDIAC) PF 100 MG/5ML IV SOSY
PREFILLED_SYRINGE | INTRAVENOUS | Status: DC | PRN
  Administered 2020-11-26: 100 mg via INTRAVENOUS

## 2020-11-26 MED ORDER — GABAPENTIN 300 MG PO CAPS
300.0000 mg | ORAL_CAPSULE | ORAL | Status: AC
  Administered 2020-11-26: 300 mg via ORAL

## 2020-11-26 MED ORDER — EPHEDRINE SULFATE 50 MG/ML IJ SOLN
INTRAMUSCULAR | Status: DC | PRN
  Administered 2020-11-26 (×2): 10 mg via INTRAVENOUS

## 2020-11-26 MED ORDER — PROPOFOL 10 MG/ML IV BOLUS
INTRAVENOUS | Status: AC
  Filled 2020-11-26: qty 40

## 2020-11-26 MED ORDER — ACETAMINOPHEN 500 MG PO TABS
ORAL_TABLET | ORAL | Status: AC
  Filled 2020-11-26: qty 2

## 2020-11-26 MED ORDER — TRAMADOL HCL 50 MG PO TABS: 50.0000 mg | ORAL_TABLET | Freq: Four times a day (QID) | ORAL | 0 refills | Status: DC | PRN

## 2020-11-26 MED ORDER — GABAPENTIN 300 MG PO CAPS
ORAL_CAPSULE | ORAL | Status: AC
  Filled 2020-11-26: qty 1

## 2020-11-26 MED ORDER — ROCURONIUM BROMIDE 10 MG/ML (PF) SYRINGE
PREFILLED_SYRINGE | INTRAVENOUS | Status: AC
  Filled 2020-11-26: qty 10

## 2020-11-26 MED ORDER — CELECOXIB 200 MG PO CAPS
200.0000 mg | ORAL_CAPSULE | ORAL | Status: AC
  Administered 2020-11-26: 200 mg via ORAL

## 2020-11-26 MED ORDER — LIDOCAINE HCL (PF) 2 % IJ SOLN
INTRAMUSCULAR | Status: AC
  Filled 2020-11-26: qty 5

## 2020-11-26 MED ORDER — EPHEDRINE 5 MG/ML INJ
INTRAVENOUS | Status: AC
  Filled 2020-11-26: qty 10

## 2020-11-26 MED ORDER — PROMETHAZINE HCL 25 MG/ML IJ SOLN
6.2500 mg | INTRAMUSCULAR | Status: DC | PRN
Start: 2020-11-26 — End: 2020-11-26

## 2020-11-26 MED ORDER — FENTANYL CITRATE (PF) 100 MCG/2ML IJ SOLN
INTRAMUSCULAR | Status: DC | PRN
  Administered 2020-11-26 (×6): 50 ug via INTRAVENOUS

## 2020-11-26 SURGICAL SUPPLY — 83 items
ADH SKN CLS APL DERMABOND .7 (GAUZE/BANDAGES/DRESSINGS)
APL PRP STRL LF DISP 70% ISPRP (MISCELLANEOUS) ×2
APL SKNCLS STERI-STRIP NONHPOA (GAUZE/BANDAGES/DRESSINGS) ×2
BENZOIN TINCTURE PRP APPL 2/3 (GAUZE/BANDAGES/DRESSINGS) ×4 IMPLANT
BLADE CLIPPER SENSICLIP SURGIC (BLADE) ×4 IMPLANT
BLADE HEX COATED 2.75 (ELECTRODE) ×4 IMPLANT
BLADE SURG 15 STRL LF DISP TIS (BLADE) ×2 IMPLANT
BLADE SURG 15 STRL SS (BLADE) ×4
CABLE HIGH FREQUENCY MONO STRZ (ELECTRODE) ×8 IMPLANT
CANISTER SUCT 1200ML W/VALVE (MISCELLANEOUS) IMPLANT
CANISTER SUCT 3000ML PPV (MISCELLANEOUS) IMPLANT
CHLORAPREP W/TINT 26 (MISCELLANEOUS) ×4 IMPLANT
CLIPPER FILTER TUBING DISPOS (CLIP) ×4
CLOSURE WOUND 1/2 X4 (GAUZE/BANDAGES/DRESSINGS) ×1
COVER BACK TABLE 60X90IN (DRAPES) IMPLANT
COVER MAYO STAND STRL (DRAPES) IMPLANT
COVER WAND RF STERILE (DRAPES) ×4 IMPLANT
DECANTER SPIKE VIAL GLASS SM (MISCELLANEOUS) ×4 IMPLANT
DERMABOND ADVANCED (GAUZE/BANDAGES/DRESSINGS)
DERMABOND ADVANCED .7 DNX12 (GAUZE/BANDAGES/DRESSINGS) IMPLANT
DEVICE SECURE STRAP 25 ABSORB (INSTRUMENTS) IMPLANT
DRAPE LAPAROTOMY 100X72 PEDS (DRAPES) IMPLANT
DRAPE UTILITY XL STRL (DRAPES) ×4 IMPLANT
DRAPE WARM FLUID 44X44 (DRAPES) ×4 IMPLANT
DRSG TEGADERM 2-3/8X2-3/4 SM (GAUZE/BANDAGES/DRESSINGS) ×4 IMPLANT
DRSG TEGADERM 4X4.75 (GAUZE/BANDAGES/DRESSINGS) ×4 IMPLANT
DURAPREP 26ML APPLICATOR (WOUND CARE) IMPLANT
ELECT REM PT RETURN 9FT ADLT (ELECTROSURGICAL) ×4
ELECTRODE REM PT RTRN 9FT ADLT (ELECTROSURGICAL) ×2 IMPLANT
FILTER TUBING DISPOS CLIPPER (CLIP) ×2 IMPLANT
GAUZE SPONGE 2X2 8PLY STRL LF (GAUZE/BANDAGES/DRESSINGS) ×2 IMPLANT
GAUZE SPONGE 4X4 12PLY STRL (GAUZE/BANDAGES/DRESSINGS) ×8 IMPLANT
GLOVE ECLIPSE 8.0 STRL XLNG CF (GLOVE) ×4 IMPLANT
GLOVE INDICATOR 8.0 STRL GRN (GLOVE) ×4 IMPLANT
GLOVE SS BIOGEL STRL SZ 7 (GLOVE) ×4 IMPLANT
GLOVE SS BIOGEL STRL SZ 7.5 (GLOVE) ×2 IMPLANT
GLOVE SUPERSENSE BIOGEL SZ 7 (GLOVE) ×4
GLOVE SUPERSENSE BIOGEL SZ 7.5 (GLOVE) ×2
GLOVE SURG ENC MOIS LTX SZ7.5 (GLOVE) ×4 IMPLANT
GOWN STRL REUS W/TWL XL LVL3 (GOWN DISPOSABLE) ×4 IMPLANT
IRRIG SUCT STRYKERFLOW 2 WTIP (MISCELLANEOUS)
IRRIGATION SUCT STRKRFLW 2 WTP (MISCELLANEOUS) IMPLANT
IV NS IRRIG 3000ML ARTHROMATIC (IV SOLUTION) ×4 IMPLANT
KIT TURNOVER CYSTO (KITS) ×4 IMPLANT
MANIFOLD NEPTUNE II (INSTRUMENTS) ×4 IMPLANT
MESH HERNIA 6X6 BARD (Mesh General) ×2 IMPLANT
MESH HERNIA BARD 6X6 (Mesh General) ×2 IMPLANT
MESH ULTRAPRO 6X6 15CM15CM (Mesh General) ×8 IMPLANT
NEEDLE HYPO 22GX1.5 SAFETY (NEEDLE) ×4 IMPLANT
NS IRRIG 500ML POUR BTL (IV SOLUTION) ×4 IMPLANT
PACK BASIN DAY SURGERY FS (CUSTOM PROCEDURE TRAY) ×4 IMPLANT
PAD POSITIONING PINK XL (MISCELLANEOUS) ×4 IMPLANT
PENCIL SMOKE EVACUATOR (MISCELLANEOUS) ×4 IMPLANT
SCISSORS LAP 5X35 DISP (ENDOMECHANICALS) ×4 IMPLANT
SET TUBE SMOKE EVAC HIGH FLOW (TUBING) ×4 IMPLANT
SLEEVE ADV FIXATION 5X100MM (TROCAR) ×4 IMPLANT
SLEEVE SCD COMPRESS KNEE MED (MISCELLANEOUS) ×4 IMPLANT
SPONGE GAUZE 2X2 8PLY STER LF (GAUZE/BANDAGES/DRESSINGS) ×3
SPONGE GAUZE 2X2 8PLY STRL LF (GAUZE/BANDAGES/DRESSINGS) ×9 IMPLANT
SPONGE GAUZE 2X2 STER 10/PKG (GAUZE/BANDAGES/DRESSINGS) ×2
SPONGE LAP 18X18 RF (DISPOSABLE) IMPLANT
STRIP CLOSURE SKIN 1/2X4 (GAUZE/BANDAGES/DRESSINGS) ×3 IMPLANT
SUT MNCRL AB 4-0 PS2 18 (SUTURE) ×4 IMPLANT
SUT MON AB 4-0 PC3 18 (SUTURE) ×4 IMPLANT
SUT PDS AB 1 CT1 27 (SUTURE) IMPLANT
SUT PROLENE 0 CT 1 30 (SUTURE) ×8 IMPLANT
SUT SILK 3 0 SH 30 (SUTURE) IMPLANT
SUT VIC AB 2-0 SH 27 (SUTURE) ×12
SUT VIC AB 2-0 SH 27XBRD (SUTURE) ×6 IMPLANT
SUT VIC AB 3-0 54X BRD REEL (SUTURE) IMPLANT
SUT VIC AB 3-0 BRD 54 (SUTURE)
SUT VIC AB 3-0 SH 27 (SUTURE) ×4
SUT VIC AB 3-0 SH 27X BRD (SUTURE) ×2 IMPLANT
SUT VICRYL 0 UR6 27IN ABS (SUTURE) ×4 IMPLANT
TOWEL OR 17X26 10 PK STRL BLUE (TOWEL DISPOSABLE) ×4 IMPLANT
TRAY DSU PREP LF (CUSTOM PROCEDURE TRAY) ×4 IMPLANT
TRAY LAPAROSCOPIC (CUSTOM PROCEDURE TRAY) ×4 IMPLANT
TROCAR ADV FIXATION 5X100MM (TROCAR) ×4 IMPLANT
TROCAR XCEL BLUNT TIP 100MML (ENDOMECHANICALS) ×4 IMPLANT
TUBE CONNECTING 12'X1/4 (SUCTIONS) ×1
TUBE CONNECTING 12X1/4 (SUCTIONS) ×3 IMPLANT
WATER STERILE IRR 500ML POUR (IV SOLUTION) ×4 IMPLANT
YANKAUER SUCT BULB TIP NO VENT (SUCTIONS) IMPLANT

## 2020-11-26 NOTE — Transfer of Care (Signed)
Immediate Anesthesia Transfer of Care Note  Patient: Austin Lane  Procedure(s) Performed: Procedure(s) (LRB): LAPAROSCOPIC BILATERAL INGUINAL HERNIA REPAIR (Bilateral) HERNIA REPAIR UMBILICAL ADULT (N/A)  Patient Location: PACU  Anesthesia Type: General  Level of Consciousness: awake, sedated, patient cooperative and responds to stimulation  Airway & Oxygen Therapy: Patient Spontanous Breathing and Patient connected to [place] 02 and soft FM   Post-op Assessment: Report given to PACU RN, Post -op Vital signs reviewed and stable and Patient moving all extremities  Post vital signs: Reviewed and stable  Complications: No apparent anesthesia complications

## 2020-11-26 NOTE — Anesthesia Procedure Notes (Signed)
Procedure Name: Intubation Date/Time: 11/26/2020 7:42 AM Performed by: Justice Rocher, CRNA Pre-anesthesia Checklist: Patient identified, Emergency Drugs available, Suction available, Patient being monitored and Timeout performed Patient Re-evaluated:Patient Re-evaluated prior to induction Oxygen Delivery Method: Circle system utilized Preoxygenation: Pre-oxygenation with 100% oxygen Induction Type: IV induction Ventilation: Mask ventilation without difficulty Laryngoscope Size: Mac and 4 Grade View: Grade II Tube type: Oral Tube size: 7.5 mm Number of attempts: 1 Airway Equipment and Method: Stylet and Oral airway Placement Confirmation: ETT inserted through vocal cords under direct vision,  positive ETCO2,  breath sounds checked- equal and bilateral and CO2 detector Secured at: 21 cm Tube secured with: Tape Dental Injury: Teeth and Oropharynx as per pre-operative assessment

## 2020-11-26 NOTE — H&P (Signed)
Austin Lane DOB: Dec 16, 1955   Patient Care Team: London Pepper, MD as PCP - General (Family Medicine) Michael Boston, MD as Consulting Physician (General Surgery)  ` ` Patient sent for surgical consultation at the request of Dr Marjory Lies Marrow  Chief Complaint: Right inguinal hernia. ` ` The patient is an active male. History of left inguinal hernia repaired in the 1990s with mesh. Developed some bulging discomfort after doing some yardwork. Suspected right inguinal hernia. Discussed with primary care physician. Dr. Orland Mustard recommended surgical evaluation. Patient had an open inguinal hernia repair on the left side in the early 1990s. He thinks Dr. Deon Pilling. Has not any symptoms or problems there. He notes an obvious hernia on the right side. He had cholecystectomy done laparoscopically in the late 1990s. No other abdominal surgeries. He is moderately physically active and walk several miles without difficulty. Usually [name] once a morning. No diabetes. No smoking or sleep apnea. Usually can go on a treadmill for several miles. Goes to the gym about 3 times a week. Is on some gabapentin for some history of kidney stones and pain.  (Review of systems as stated in this history (HPI) or in the review of systems. Otherwise all other 12 point ROS are negative) ` ` ###########################################`  This patient encounter took 30 minutes today to perform the following: obtain history, perform exam, review outside records, interpret tests & imaging, counsel the patient on their diagnosis; and, document this encounter, including findings & plan in the electronic health record (EHR).   Past Surgical History Emeline Gins, [place]; 09/28/2020 2:59 PM) Colon Polyp Removal - Colonoscopy Gallbladder Surgery - Laparoscopic Open Inguinal Hernia Surgery Left. Oral Surgery  Diagnostic Studies History Emeline Gins, [place]; 09/28/2020 2:59 PM) Colonoscopy 1-5  years ago  Allergies Emeline Gins, [place]; 09/28/2020 2:59 PM) No Known Drug Allergies [09/28/2020]: Allergies Reconciled  Medication History Emeline Gins, CMA; 09/28/2020 3:00 PM) Olmesartan Medoxomil-HCTZ (40-25MG  Tablet, Oral) Active. Potassium Citrate ER (5 MEQ(540 MG) Tablet ER, Oral) Active. Gabapentin (100MG  Capsule, Oral) Active. Omega 3 (1000MG  Capsule, Oral) Active. Medications Reconciled  Social History Emeline Gins, [place]; 09/28/2020 2:59 PM) Alcohol use Occasional alcohol use. Caffeine use Carbonated beverages, Coffee. No drug use Tobacco use Never smoker.  Family History Emeline Gins, [place]; 09/28/2020 2:59 PM) Depression Mother. Heart Disease Brother. Heart disease in male family member before age 24 Hypertension Father, Mother. Prostate Cancer Father.  Other Problems Emeline Gins, [place]; 09/28/2020 2:59 PM) Cholelithiasis High blood pressure Inguinal Hernia Kidney Stone     Review of Systems Emeline Gins CMA; 09/28/2020 2:59 PM) General Not Present- Appetite Loss, Chills, Fatigue, Fever, Night Sweats, Weight Gain and Weight Loss. Skin Not Present- Change in Wart/Mole, Dryness, Hives, Jaundice, New Lesions, Non-Healing Wounds, Rash and Ulcer. HEENT Present- Wears glasses/contact lenses. Not Present- Earache, Hearing Loss, Hoarseness, Nose Bleed, Oral Ulcers, Ringing in the Ears, Seasonal Allergies, Sinus Pain, Sore Throat, Visual Disturbances and Yellow Eyes. Respiratory Not Present- Bloody sputum, Chronic Cough, Difficulty Breathing, Snoring and Wheezing. Breast Not Present- Breast Mass, Breast Pain, Nipple Discharge and Skin Changes. Cardiovascular Not Present- Chest Pain, Difficulty Breathing Lying Down, Leg Cramps, Palpitations, Rapid Heart Rate, Shortness of Breath and Swelling of Extremities. Gastrointestinal Not Present- Abdominal Pain, Bloating, Bloody Stool, Change in Bowel Habits, Chronic diarrhea,  Constipation, Difficulty Swallowing, Excessive gas, Gets full quickly at meals, Hemorrhoids, Indigestion, Nausea, Rectal Pain and Vomiting. Male Genitourinary Not Present- Blood in Urine, Change in Urinary Stream, Frequency, Impotence, Nocturia, Painful Urination, Urgency and Urine Leakage.  Musculoskeletal Not Present- Back Pain, Joint Pain, Joint Stiffness, Muscle Pain, Muscle Weakness and Swelling of Extremities. Neurological Not Present- Decreased Memory, Fainting, Headaches, Numbness, Seizures, Tingling, Tremor, Trouble walking and Weakness. Psychiatric Not Present- Anxiety, Bipolar, Change in Sleep Pattern, Depression, Fearful and Frequent crying. Endocrine Not Present- Cold Intolerance, Excessive Hunger, Hair Changes, Heat Intolerance, Hot flashes and New Diabetes. Hematology Not Present- Blood Thinners, Easy Bruising, Excessive bleeding, Gland problems, HIV and Persistent Infections.  Vitals Emeline Gins CMA; 09/28/2020 2:59 PM) 09/28/2020 2:59 PM Weight: 213 lb Height: 71in Body Surface Area: 2.17 m Body Mass Index: 29.71 kg/m  Temp.: 68F  Pulse: 89 (Regular)  BP: 142/84(Sitting, Left Arm, Standard)        Physical Exam [name] MD; 09/28/2020 5:30 PM)  General Mental Status-Alert. General Appearance-Not in acute distress, Not Sickly. Orientation-Oriented X3. Hydration-Well hydrated. Voice-Normal.  Integumentary Global Assessment Upon inspection and palpation of skin surfaces of the - Axillae: non-tender, no inflammation or ulceration, no drainage. and Distribution of scalp and body hair is normal. General Characteristics Temperature - normal warmth is noted.  Head and Neck Head-normocephalic, atraumatic with no lesions or palpable masses. Face Global Assessment - atraumatic, no absence of expression. Neck Global Assessment - no abnormal movements, no bruit auscultated on the right, no bruit auscultated on the left, no  decreased range of motion, non-tender. Trachea-midline. Thyroid Gland Characteristics - non-tender.  Eye Eyeball - Left-Extraocular movements intact, No Nystagmus - Left. Eyeball - Right-Extraocular movements intact, No Nystagmus - Right. Cornea - Left-No Hazy - Left. Cornea - Right-No Hazy - Right. Sclera/Conjunctiva - Left-No scleral icterus, No Discharge - Left. Sclera/Conjunctiva - Right-No scleral icterus, No Discharge - Right. Pupil - Left-Direct reaction to light normal. Pupil - Right-Direct reaction to light normal.  ENMT Ears Pinna - Left - no drainage observed, no generalized tenderness observed. Pinna - Right - no drainage observed, no generalized tenderness observed. Nose and Sinuses External Inspection of the Nose - no destructive lesion observed. Inspection of the nares - Left - quiet respiration. Inspection of the nares - Right - quiet respiration. Mouth and Throat Lips - Upper Lip - no fissures observed, no pallor noted. Lower Lip - no fissures observed, no pallor noted. Nasopharynx - no discharge present. Oral Cavity/Oropharynx - Tongue - no dryness observed. Oral Mucosa - no cyanosis observed. Hypopharynx - no evidence of airway distress observed.  Chest and Lung Exam Inspection Movements - Normal and Symmetrical. Accessory muscles - No use of accessory muscles in breathing. Palpation Palpation of the chest reveals - Non-tender. Auscultation Breath sounds - Normal and Clear.  Cardiovascular Auscultation Rhythm - Regular. Murmurs & Other Heart Sounds - Auscultation of the heart reveals - No Murmurs and No Systolic Clicks.  Abdomen Inspection Inspection of the abdomen reveals - No Visible peristalsis and No Abnormal pulsations. Umbilicus - No Bleeding, No Urine drainage. Palpation/Percussion Palpation and Percussion of the abdomen reveal - Soft, Non Tender, No Rebound tenderness, No Rigidity (guarding) and No Cutaneous  hyperesthesia. Note: Abdomen soft. Nontender. Not distended. Small umbilical hernia through the stalk only. No guarding.  Male Genitourinary Sexual Maturity Tanner 5 - Adult hair pattern and Adult penile size and shape. Note: ########################################################   Obvious right inguinal hernia. Mildly sensitive but reducible. No definite left inguinal recurrence. Otherwise normal external male genitalia  Peripheral Vascular Upper Extremity Inspection - Left - No Cyanotic nailbeds - Left, Not Ischemic. Inspection - Right - No Cyanotic nailbeds - Right, Not Ischemic.  Neurologic Neurologic  evaluation reveals -normal attention span and ability to concentrate, able to name objects and repeat phrases. Appropriate fund of knowledge , normal sensation and normal coordination. Mental Status Affect - not angry, not paranoid. Cranial Nerves-Normal Bilaterally. Gait-Normal.  Neuropsychiatric Mental status exam performed with findings of-able to articulate well with normal speech/language, rate, volume and coherence, thought content normal with ability to perform basic computations and apply abstract reasoning and no evidence of hallucinations, delusions, obsessions or homicidal/suicidal ideation.  Musculoskeletal Global Assessment Spine, Ribs and Pelvis - no instability, subluxation or laxity. Right Upper Extremity - no instability, subluxation or laxity.  Lymphatic Head & Neck  General Head & Neck Lymphatics: Bilateral - Description - No Localized lymphadenopathy. Axillary  General Axillary Region: Bilateral - Description - No Localized lymphadenopathy. Femoral & Inguinal  Generalized Femoral & Inguinal Lymphatics: Left - Description - No Localized lymphadenopathy. Right - Description - No Localized lymphadenopathy.    Assessment & Plan   RIGHT INGUINAL HERNIA (K40.90) Impression: Obvious right inguinal hernia reducible. No strong  evidence of any recurrence on the left side but will to my due diligence to disprove any new hernias on that side  I think he would benefit from hernia repair. Laparoscopic approach. He likes the idea of doing a laparoscopic approach. We will work to coordinate a convenient time   The anatomy & physiology of the abdominal wall and pelvic floor was discussed. The pathophysiology of hernias in the inguinal and pelvic region was discussed. Natural history risks such as progressive enlargement, pain, incarceration, and strangulation was discussed. Contributors to complications such as smoking, obesity, diabetes, prior surgery, etc were discussed.  I feel the risks of no intervention will lead to serious problems that outweigh the operative risks; therefore, I recommended surgery to reduce and repair the hernia. I explained laparoscopic techniques with possible need for an open approach. I noted usual use of mesh to patch and/or buttress hernia repair  Risks such as bleeding, infection, abscess, need for further treatment, heart attack, death, and other risks were discussed. I noted a good likelihood this will help address the problem. Goals of post-operative recovery were discussed as well. Possibility that this will not correct all symptoms was explained. I stressed the importance of low-impact activity, aggressive pain control, avoiding constipation, & not pushing through pain to minimize risk of post-operative chronic pain or injury. Possibility of reherniation was discussed. We will work to minimize complications.  An educational handout further explaining the pathology & treatment options was given as well. Questions were answered. The patient expresses understanding & wishes to proceed with surgery.  Pt Education - Pamphlet Given - Laparoscopic Hernia Repair: discussed with patient and provided information. Pt Education - [facility] Pain Control (Edwar Coe) Pt Education - [facility] Hernia  Post-Op HCI (Rosaleigh Brazzel): discussed with patient and provided information. Pt Education - [facility] Mesh education: discussed with patient and provided information.  UMBILICAL HERNIA WITHOUT OBSTRUCTION AND WITHOUT GANGRENE (K42.9) Impression: Small umbilical hernia within the stalk. Most likely can resolve with primary repair  [name], MD, FACS, MASCRS Gastrointestinal and Minimally Invasive Surgery  [facility] [address] [place], [place] [address] [phone] Fax [phone] Main/Paging  CONTACT INFORMATION: Weekday (9AM-5PM) concerns: Call [facility] main office at [phone] Weeknight (5PM-9AM) or Weekend/Holiday concerns: Check [email or web address] for General Surgery [facility] coverage (Please, do not use SecureChat as it is not reliable communication to operating surgeons for immediate patient care)   [name], MD, FACS, MASCRS Gastrointestinal and  Minimally Invasive Surgery  [facility] [address], [place], [place] [address] [phone] Fax [phone] Main/Paging  CONTACT INFORMATION: Weekday (9AM-5PM) concerns: Call [facility] main office at [phone] Weeknight (5PM-9AM) or Weekend/Holiday concerns: Check [email or web address] for General Surgery [facility] coverage (Please, do not use SecureChat as it is not reliable communication to operating surgeons for immediate patient care)

## 2020-11-26 NOTE — Discharge Instructions (Signed)
HERNIA REPAIR: POST OP INSTRUCTIONS  ######################################################################  EAT Gradually transition to a high fiber diet with a fiber supplement over the next few weeks after discharge.  Start with a pureed / full liquid diet (see below)  WALK Walk an hour a day.  Control your pain to do that.    CONTROL PAIN Control pain so that you can walk, sleep, tolerate sneezing/coughing, and go up/down stairs.  HAVE A BOWEL MOVEMENT DAILY Keep your bowels regular to avoid problems.  OK to try a laxative to override constipation.  OK to use an antidairrheal to slow down diarrhea.  Call if not better after 2 tries  CALL IF YOU HAVE PROBLEMS/CONCERNS Call if you are still struggling despite following these instructions. Call if you have concerns not answered by these instructions  ######################################################################    1. DIET: Follow a light bland diet & liquids the first 24 hours after arrival home, such as soup, liquids, starches, etc.  Be sure to drink plenty of fluids.  Quickly advance to a usual solid diet within a few days.  Avoid fast food or heavy meals as your are more likely to get nauseated or have irregular bowels.  A low-fat, high-fiber diet for the rest of your life is ideal.   2. Take your usually prescribed home medications unless otherwise directed.  3. PAIN CONTROL: a. Pain is best controlled by a usual combination of three different methods TOGETHER: i. Ice/Heat ii. Over the counter pain medication iii. Prescription pain medication b. Most patients will experience some swelling and bruising around the hernia(s) such as the bellybutton, groins, or old incisions.  Ice packs or heating pads (30-60 minutes up to 6 times a day) will help. Use ice for the first few days to help decrease swelling and bruising, then switch to heat to help relax tight/sore spots and speed recovery.  Some people prefer to use ice  alone, heat alone, alternating between ice & heat.  Experiment to what works for you.  Swelling and bruising can take several weeks to resolve.   c. It is helpful to take an over-the-counter pain medication regularly for the first few weeks.  Choose one of the following that works best for you: i. Naproxen (Aleve, etc)  Two 244m tabs twice a day ii. Ibuprofen (Advil, etc) Three 2036mtabs four times a day (every meal & bedtime) iii. Acetaminophen (Tylenol, etc) 325-65016mour times a day (every meal & bedtime) d. A  prescription for pain medication should be given to you upon discharge.  Take your pain medication as prescribed.  i. If you are having problems/concerns with the prescription medicine (does not control pain, nausea, vomiting, rash, itching, etc), please call us [phone] see if we need to switch you to a different pain medicine that will work better for you and/or control your side effect better. ii. If you need a refill on your pain medication, please contact your pharmacy.  They will contact our office to request authorization. Prescriptions will not be filled after 5 pm or on week-ends.  4. Avoid getting constipated.  Between the surgery and the pain medications, it is common to experience some constipation.  Increasing fluid intake and taking a fiber supplement (such as Metamucil, Citrucel, FiberCon, MiraLax, etc) 1-2 times a day regularly will usually help prevent this problem from occurring.  A mild laxative (prune juice, Milk of Magnesia, MiraLax, etc) should be taken according to package directions if there are no bowel movements after 48  hours.    5. Wash / shower every day.  You may shower over the dressings as they are waterproof.    6. Remove your waterproof bandages, skin tapes, and other bandages 3 days after surgery. You may replace a dressing/Band-Aid to cover the incision for comfort if you wish. You may leave the incisions open to air.  You may replace a  dressing/Band-Aid to cover an incision for comfort if you wish.  Continue to shower over incision(s) after the dressing is off.  7. ACTIVITIES as tolerated:   a. You may resume regular (light) daily activities beginning the next day--such as daily self-care, walking, climbing stairs--gradually increasing activities as tolerated.  Control your pain so that you can walk an hour a day.  If you can walk 30 minutes without difficulty, it is safe to try more intense activity such as jogging, treadmill, bicycling, low-impact aerobics, swimming, etc. b. Save the most intensive and strenuous activity for last such as sit-ups, heavy lifting, contact sports, etc  Refrain from any heavy lifting or straining until you are off narcotics for pain control.   c. DO NOT PUSH THROUGH PAIN.  Let pain be your guide: If it hurts to do something, don't do it.  Pain is your body warning you to avoid that activity for another week until the pain goes down. d. You may drive when you are no longer taking prescription pain medication, you can comfortably wear a seatbelt, and you can safely maneuver your car and apply brakes. e. Dennis Bast may have sexual intercourse when it is comfortable.   8. FOLLOW UP in our office a. Please call [facility] at [phone] to set up an appointment to see your surgeon in the office for a follow-up appointment approximately 2-3 weeks after your surgery. b. Make sure that you call for this appointment the day you arrive home to insure a convenient appointment time.  9.  If you have disability of FMLA / Family leave forms, please bring the forms to the office for processing.  (do not give to your surgeon).  WHEN TO CALL us [phone]: 1. Poor pain control 2. Reactions / problems with new medications (rash/itching, nausea, etc)  3. Fever over 101.5 F (38.5 C) 4. Inability to urinate 5. Nausea and/or vomiting 6. Worsening swelling or bruising 7. Continued bleeding from incision. 8. Increased pain,  redness, or drainage from the incision   The clinic staff is available to answer your questions during regular business hours (8:30am-5pm).  Please don't hesitate to call and ask to speak to one of our nurses for clinical concerns.   If you have a medical emergency, go to the nearest emergency room or call 911.  A surgeon from [facility] is always on call at the hospitals in [facility], [place], [place], [place], [place]  [address] ?  [address] [address], [place], [place]   [address] MAIN: [phone] ? TOLL FREE: [phone] ? FAX: [phone] [email or web address]  Information for Discharge Teaching: EXPAREL (bupivacaine liposome injectable suspension)   Your surgeon or anesthesiologist gave you EXPAREL(bupivacaine) to help control your pain after surgery.   EXPAREL is a local anesthetic that provides pain relief by numbing the tissue around the surgical site.  EXPAREL is designed to release pain medication over time and can control pain for up to 72 hours.  Depending on how you respond to EXPAREL, you may require less pain medication during your recovery.  Possible  side effects:  Temporary loss of sensation or ability to move in the area where bupivacaine was injected.  Nausea, vomiting, constipation  Rarely, numbness and tingling in your mouth or lips, lightheadedness, or anxiety may occur.  Call your doctor right away if you think you may be experiencing any of these sensations, or if you have other questions regarding possible side effects.  Follow all other discharge instructions given to you by your surgeon or nurse. Eat a healthy diet and drink plenty of water or other fluids.  If you return to the hospital for any reason within 96 hours following the administration of EXPAREL, it is important for health care providers to know that you have received this anesthetic. A teal colored band has been placed on your  arm with the date, time and amount of EXPAREL you have received in order to alert and inform your health care providers. Please leave this armband in place for the full 96 hours following administration, and then you may remove the band.   Post Anesthesia Home Care Instructions  Activity: Get plenty of rest for the remainder of the day. A responsible individual must stay with you for 24 hours following the procedure.  For the next 24 hours, DO NOT: -Drive a car -Paediatric nurse -Drink alcoholic beverages -Take any medication unless instructed by your physician -Make any legal decisions or sign important papers.  Meals: Start with liquid foods such as gelatin or soup. Progress to regular foods as tolerated. Avoid greasy, spicy, heavy foods. If nausea and/or vomiting occur, drink only clear liquids until the nausea and/or vomiting subsides. Call your physician if vomiting continues.  Special Instructions/Symptoms: Your throat may feel dry or sore from the anesthesia or the breathing tube placed in your throat during surgery. If this causes discomfort, gargle with warm salt water. The discomfort should disappear within 24 hours.

## 2020-11-26 NOTE — Anesthesia Preprocedure Evaluation (Signed)
Anesthesia Evaluation  Patient identified by MRN, date of birth, ID band Patient awake    Reviewed: Allergy & Precautions, NPO status , Patient's Chart, lab work & pertinent test results  Airway Mallampati: II  TM Distance: >3 FB Neck ROM: Full    Dental  (+) Teeth Intact, Dental Advisory Given   Pulmonary neg pulmonary ROS,    Pulmonary exam normal breath sounds clear to auscultation       Cardiovascular hypertension, Pt. on medications Normal cardiovascular exam Rhythm:Regular Rate:Normal     Neuro/Psych negative neurological ROS     GI/Hepatic Neg liver ROS, RIH Umbilical hernia    Endo/Other  negative endocrine ROS  Renal/GU negative Renal ROS     Musculoskeletal negative musculoskeletal ROS (+)   Abdominal   Peds  Hematology negative hematology ROS (+)   Anesthesia Other Findings Day of surgery medications reviewed with the patient.  Reproductive/Obstetrics                             Anesthesia Physical Anesthesia Plan  ASA: II  Anesthesia Plan: General   Post-op Pain Management:    Induction: Intravenous  PONV Risk Score and Plan: 3 and Dexamethasone, Ondansetron and Midazolam  Airway Management Planned: Oral ETT  Additional Equipment:   Intra-op Plan:   Post-operative Plan: Extubation in OR  Informed Consent: I have reviewed the patients History and Physical, chart, labs and discussed the procedure including the risks, benefits and alternatives for the proposed anesthesia with the patient or authorized representative who has indicated his/her understanding and acceptance.     Dental advisory given  Plan Discussed with: CRNA  Anesthesia Plan Comments:         Anesthesia Quick Evaluation

## 2020-11-26 NOTE — Op Note (Signed)
11/26/2020  9:49 AM  PATIENT:  Austin Lane  65 y.o. male  Patient Care Team: London Pepper, MD as PCP - General (Family Medicine) Michael Boston, MD as Consulting Physician (General Surgery)  PRE-OPERATIVE DIAGNOSIS:  UMBILICAL HERNIA, BILATERAL INGUINAL HERNIAS  POST-OPERATIVE DIAGNOSIS:   UMBILICAL HERNIA RIGHT INGUINAL HERNIA BILATERAL FEMORAL HERNIAS   PROCEDURE:   LAPAROSCOPIC BILATERAL FEMORAL HERNIA REPAIRS WITH MESH LAPAROSCOPIC  INGUINAL HERNIA REPAIR WITH MESH PRIMARY UMBILICAL HERNIA REPAIR TAP BLOCK - BILATERAL  SURGEON:  [name], MD  ASSISTANT: Eulis Foster, MD.  Jarrett Soho PGY 3   ANESTHESIA:     Regional ilioinguinal and genitofemoral and spermatic cord nerve blocks  General  Nerve block provided with liposomal bupivacaine (Experel) mixed with 0.25% bupivacaine as a Bilateral TAP block x 37mL each side at the level of the transverse abdominis & preperitoneal spaces along the flank at the anterior axillary line, from subcostal ridge to iliac crest under laparoscopic guidance    EBL:  No intake/output data recorded..  See anesthesia record  Delay start of Pharmacological VTE agent (>24hrs) due to surgical blood loss or risk of bleeding:  no  DRAINS: NONE  SPECIMEN:   NONE  DISPOSITION OF SPECIMEN:  N/A  COUNTS:  YES  PLAN OF CARE: Discharge to home after PACU  PATIENT DISPOSITION:  PACU - hemodynamically stable.  INDICATION: Active gentleman with history of prior open left inguinal hernia repair in the 1990s.  Obvious right inguinal hernia.  Some possible recurrent versus lower hernia on the left side noted.  Small umbilical hernia.  Recommendation made for hernia repairs  The anatomy & physiology of the abdominal wall and pelvic floor was discussed.  The pathophysiology of hernias in the inguinal and pelvic region was discussed.  Natural history risks such as progressive enlargement, pain, incarceration & strangulation was  discussed.   Contributors to complications such as smoking, obesity, diabetes, prior surgery, etc were discussed.    I feel the risks of no intervention will lead to serious problems that outweigh the operative risks; therefore, I recommended surgery to reduce and repair the hernia.  I explained laparoscopic techniques with possible need for an open approach.  I noted usual use of mesh to patch and/or buttress hernia repair  Risks such as bleeding, infection, abscess, need for further treatment, heart attack, death, and other risks were discussed.  I noted a good likelihood this will help address the problem.   Goals of post-operative recovery were discussed as well.  Possibility that this will not correct all symptoms was explained.  I stressed the importance of low-impact activity, aggressive pain control, avoiding constipation, & not pushing through pain to minimize risk of post-operative chronic pain or injury. Possibility of reherniation was discussed.  We will work to minimize complications.     An educational handout further explaining the pathology & treatment options was given as well.  Questions were answered.  The patient expresses understanding & wishes to proceed with surgery.  OR FINDINGS: Moderately large direct space inguinal hernia on the right side.  Mild laxity concerning for femoral hernia as well.  No indirect nor obturator hernias.  On the left side, moderate femoral hernia incarcerated with preperitoneal fat.  Intact direct/indirect prior inguinal hernia repairs.  No obturator hernia.  7 mm umbilical hernia through the stalk.  Primarily repaired.  DESCRIPTION:  The patient was identified & brought into the operating room. The patient was positioned supine with arms tucked. SCDs were active during  the entire case. The patient underwent general anesthesia without any difficulty.  The abdomen was prepped and draped in a sterile fashion. The patient's bladder was emptied.  A  Surgical Timeout confirmed our plan.  I made a transverse incision through the inferior umbilical fold.  I made a small transverse nick through the anterior rectus fascia contralateral to the inguinal hernia side and placed a 0-vicryl stitch through the fascia.  I placed a Hasson trocar into the preperitoneal plane.  Entry was clean.  We induced carbon dioxide insufflation. Camera inspection revealed no injury.  I used a 64mm angled scope to bluntly free the peritoneum off the infraumbilical anterior abdominal wall.  I created enough of a preperitoneal pocket to place 47mm ports into the right & left mid-abdomen into this preperitoneal cavity.  I focused attention on the RIGHT pelvis since that was the dominant hernia side.   I used blunt & focused sharp dissection to free the peritoneum off the flank and down to the pubic rim.  I freed the anteriolateral bladder wall off the anteriolateral pelvic wall, sparing midline attachments.   I located a swath of peritoneum going into a hernia fascial defect at the  direct space consistent with  a direct space inguinal hernia.. Small but definite femoral hernia as well.  I gradually freed the peritoneal hernia sac off safely and reduced it into the preperitoneal space.  I freed the peritoneum off the spermatic vessels & vas deferens.  I freed peritoneum off the retroperitoneum along the psoas muscle.  Spermatic cord lipoma was dissected away & removed.  I checked & assured hemostasis.  High ligation of the hernia sac with peritoneal repair done with 2-0 Vicryl using laparoscopic intracorporeal suturing  I turned attention on the opposite  LEFT pelvis.  I did dissection in a similar, mirror-image fashion. The patient had a femoral hernia.Marland Kitchen   Spermatic cord lipoma was dissected away & removed.    I checked & assured hemostasis.     I chose 15x15 cm sheets of ultra-lightweight polypropylene mesh (Ultrapro), one for each side.  I cut a single sigmoid-shaped slit ~6cm from  a corner of each mesh.  I placed the meshes into the preperitoneal space & laid them as overlapping diamonds such that at the inferior points, a 6x6 cm corner flap rested in the true anterolateral pelvis, covering the obturator & femoral foramina.   I allowed the bladder to return to the pubis, this helping tuck the corners of the mesh in the anteriolateral pelvis.  The medial corners overlapped each other across midline cephalad to the pubic rim.   This provided >2 inch coverage around the hernias. Because the defects well covered and not particularly large, I did not place any tacks.   I held the hernia sacs cephalad & evacuated carbon dioxide.  I freed the umbilical stalk off the fascia to reveal a small but definite umbilical hernia.  This was primarily repaired with #1 PDS interrupted.  I closed the [facility] port site left periumbilical anterior rectus fascia with 0 Vicryl absorbable suture.  I closed the skin using 4-0 monocryl stitch.  Sterile dressings were applied.   The patient was extubated & arrived in the PACU in stable condition..  I had discussed postoperative care with the patient in the holding area.  Instructions are written in the chart. I made an attempt to locate family to discuss patient's status and recommendations.  No one is available at this time.   Revonda Standard  Johney Maine, M.D., F.A.C.S. Gastrointestinal and Minimally Invasive Surgery [facility] [address], [place], [place] [address] [phone] Main / Paging  11/26/2020 9:49 AM

## 2020-11-26 NOTE — Interval H&P Note (Signed)
History and Physical Interval Note:  11/26/2020 7:20 AM  Austin Lane  has presented today for surgery, with the diagnosis of UMBILICAL HERNIA.  The various methods of treatment have been discussed with the patient and family. After consideration of risks, benefits and other options for treatment, the patient has consented to  Procedure(s): LAPAROSCOPIC RIGHT AND POSSIBLE LEFT INGUINAL HERNIA REPAIR. (Right) HERNIA REPAIR UMBILICAL ADULT (N/A) as a surgical intervention.  The patient's history has been reviewed, patient examined, no change in status, stable for surgery.  I have reviewed the patient's chart and labs.  Questions were answered to the patient's satisfaction.    I have re-reviewed the the patient's records, history, medications, and allergies.  I have re-examined the patient.  I again discussed intraoperative plans and goals of post-operative recovery.  The patient agrees to proceed.  Austin Lane  [birth year] [id number]  Patient Care Team: London Pepper, MD as PCP - General (Family Medicine) Michael Boston, MD as Consulting Physician (General Surgery)  There are no problems to display for this patient.   Past Medical History:  Diagnosis Date   History of kidney stones    last feb 2014   Hyperlipidemia    Hypertension    Right inguinal hernia    Umbilical hernia    right   Wears glasses     Past Surgical History:  Procedure Laterality Date   CHOLECYSTECTOMY  1997 dec   [facility]   COLONOSCOPY  2019   CYSTOSCOPY     x 1   HERNIA REPAIR  1993   inguinal left   LITHOTRIPSY     for kidney stones x 4   POLYPECTOMY  2019    Social History   Socioeconomic History   Marital status: Married    Spouse name: Not on file   Number of children: Not on file   Years of education: Not on file   Highest education level: Not on file  Occupational History   Not on file  Tobacco Use   Smoking status: Never Smoker   Smokeless tobacco: Never Used  Vaping Use   Vaping Use:  Never used  Substance and Sexual Activity   Alcohol use: Yes    Comment: rare   Drug use: No   Sexual activity: Not on file  Other Topics Concern   Not on file  Social History Narrative   Not on file   Social Determinants of Health   Financial Resource Strain: Not on file  Food Insecurity: Not on file  Transportation Needs: Not on file  Physical Activity: Not on file  Stress: Not on file  Social Connections: Not on file  Intimate Partner Violence: Not on file    Family History  Problem Relation Age of Onset   Colon polyps Father    Colon cancer Neg Hx    Stomach cancer Neg Hx    Esophageal cancer Neg Hx    Rectal cancer Neg Hx     Medications Prior to Admission  Medication Sig Dispense Refill Last Dose   gabapentin (NEURONTIN) 100 MG capsule Take 100 mg by mouth every other day. Takes qod at hs   11/24/2020   olmesartan-hydrochlorothiazide (BENICAR HCT) 40-25 MG per tablet Take 1 tablet by mouth daily. Takes 1/2 pill daily   11/25/2020 at Unknown time   omega-3 acid ethyl esters (LOVAZA) 1 G capsule Take 1 g by mouth daily. OTC   11/25/2020 at Unknown time   potassium citrate (UROCIT-K) 10 MEQ (1080  MG) SR tablet Take 10 mEq by mouth 3 (three) times daily with meals. 540mg    11/25/2020 at Unknown time    Current Facility-Administered Medications  Medication Dose Route Frequency Provider Last Rate Last Admin   bupivacaine liposome (EXPAREL) 1.3 % injection 266 mg  20 mL Infiltration Once Michael Boston, MD       ceFAZolin (ANCEF) IVPB 2g/100 mL premix  2 g Intravenous On Call to OR Michael Boston, MD       Chlorhexidine Gluconate Cloth 2 % PADS 6 each  6 each Topical Once Michael Boston, MD       And   Chlorhexidine Gluconate Cloth 2 % PADS 6 each  6 each Topical Once Michael Boston, MD       Derrill Memo ON 11/27/2020] feeding supplement (ENSURE PRE-SURGERY) liquid 296 mL  296 mL Oral Once Michael Boston, MD       lactated ringers infusion   Intravenous Continuous Janeece Riggers, MD 50  mL/hr at 11/26/20 0625 New Bag at 11/26/20 3013     No Known Allergies  BP 136/87   Pulse 75   Temp 98 F (36.7 C) (Oral)   Resp 16   Ht 5\' 11"  (1.803 m)   Wt 95.8 kg   SpO2 99%   BMI 29.47 kg/m   Labs: Results for orders placed or performed during the hospital encounter of 11/26/20 (from the past 48 hour(s))  I-STAT, chem 8     Status: Abnormal   Collection Time: 11/26/20  6:26 AM  Result Value Ref Range   Sodium 139 135 - 145 mmol/L   Potassium 3.6 3.5 - 5.1 mmol/L   Chloride 101 98 - 111 mmol/L   BUN 16 8 - 23 mg/dL   Creatinine, Ser 0.80 0.61 - 1.24 mg/dL   Glucose, Bld 121 (H) 70 - 99 mg/dL    Comment: Glucose reference range applies only to samples taken after fasting for at least 8 hours.   Calcium, Ion 1.32 1.15 - 1.40 mmol/L   TCO2 24 22 - 32 mmol/L   Hemoglobin 15.3 13.0 - 17.0 g/dL   HCT 45.0 39.0 - 52.0 %    Imaging / Studies: No results found.   [name], M.D., F.A.C.S. Gastrointestinal and Minimally Invasive Surgery [facility] [address] [place], [place] [address] [phone] Main / Paging  11/26/2020 7:20 AM    [name]

## 2020-11-26 NOTE — Anesthesia Postprocedure Evaluation (Signed)
Anesthesia Post Note  Patient: Austin Lane  Procedure(s) Performed: LAPAROSCOPIC BILATERAL INGUINAL HERNIA REPAIR (Bilateral ) HERNIA REPAIR UMBILICAL ADULT (N/A )     Patient location during evaluation: PACU Anesthesia Type: General Level of consciousness: awake and alert, awake and oriented Pain management: pain level controlled Vital Signs Assessment: post-procedure vital signs reviewed and stable Respiratory status: spontaneous breathing, nonlabored ventilation and respiratory function stable Cardiovascular status: blood pressure returned to baseline and stable Postop Assessment: no apparent nausea or vomiting Anesthetic complications: no   No complications documented.  Last Vitals:  Vitals:   11/26/20 1015 11/26/20 1030  BP: 127/77 (!) 153/64  Pulse: 79 84  Resp: 12 13  Temp:    SpO2: 97% 95%    Last Pain:  Vitals:   11/26/20 1030  TempSrc:   PainSc: 0-No pain                 Catalina Gravel

## 2020-11-27 ENCOUNTER — Encounter (HOSPITAL_BASED_OUTPATIENT_CLINIC_OR_DEPARTMENT_OTHER): Payer: Self-pay | Admitting: Surgery

## 2022-03-21 DIAGNOSIS — G44229 Chronic tension-type headache, not intractable: Secondary | ICD-10-CM | POA: Diagnosis not present

## 2022-03-21 DIAGNOSIS — G43019 Migraine without aura, intractable, without status migrainosus: Secondary | ICD-10-CM | POA: Diagnosis not present

## 2022-05-31 DIAGNOSIS — R7309 Other abnormal glucose: Secondary | ICD-10-CM | POA: Diagnosis not present

## 2022-05-31 DIAGNOSIS — Z125 Encounter for screening for malignant neoplasm of prostate: Secondary | ICD-10-CM | POA: Diagnosis not present

## 2022-05-31 DIAGNOSIS — Z0000 Encounter for general adult medical examination without abnormal findings: Secondary | ICD-10-CM | POA: Diagnosis not present

## 2022-05-31 DIAGNOSIS — Z87442 Personal history of urinary calculi: Secondary | ICD-10-CM | POA: Diagnosis not present

## 2022-05-31 DIAGNOSIS — E785 Hyperlipidemia, unspecified: Secondary | ICD-10-CM | POA: Diagnosis not present

## 2022-06-07 DIAGNOSIS — Z87442 Personal history of urinary calculi: Secondary | ICD-10-CM | POA: Diagnosis not present

## 2022-06-07 DIAGNOSIS — E785 Hyperlipidemia, unspecified: Secondary | ICD-10-CM | POA: Diagnosis not present

## 2022-06-07 DIAGNOSIS — Z0000 Encounter for general adult medical examination without abnormal findings: Secondary | ICD-10-CM | POA: Diagnosis not present

## 2022-06-07 DIAGNOSIS — I10 Essential (primary) hypertension: Secondary | ICD-10-CM | POA: Diagnosis not present

## 2022-06-07 DIAGNOSIS — Z125 Encounter for screening for malignant neoplasm of prostate: Secondary | ICD-10-CM | POA: Diagnosis not present

## 2022-06-07 DIAGNOSIS — R7309 Other abnormal glucose: Secondary | ICD-10-CM | POA: Diagnosis not present

## 2022-06-30 DIAGNOSIS — L570 Actinic keratosis: Secondary | ICD-10-CM | POA: Diagnosis not present

## 2022-08-04 DIAGNOSIS — G44209 Tension-type headache, unspecified, not intractable: Secondary | ICD-10-CM | POA: Diagnosis not present

## 2022-08-04 DIAGNOSIS — Z8249 Family history of ischemic heart disease and other diseases of the circulatory system: Secondary | ICD-10-CM | POA: Diagnosis not present

## 2022-08-04 DIAGNOSIS — Z809 Family history of malignant neoplasm, unspecified: Secondary | ICD-10-CM | POA: Diagnosis not present

## 2022-08-04 DIAGNOSIS — Z008 Encounter for other general examination: Secondary | ICD-10-CM | POA: Diagnosis not present

## 2022-08-04 DIAGNOSIS — E785 Hyperlipidemia, unspecified: Secondary | ICD-10-CM | POA: Diagnosis not present

## 2022-08-04 DIAGNOSIS — E876 Hypokalemia: Secondary | ICD-10-CM | POA: Diagnosis not present

## 2022-08-04 DIAGNOSIS — I10 Essential (primary) hypertension: Secondary | ICD-10-CM | POA: Diagnosis not present

## 2022-08-05 DIAGNOSIS — H52203 Unspecified astigmatism, bilateral: Secondary | ICD-10-CM | POA: Diagnosis not present

## 2022-08-05 DIAGNOSIS — H2513 Age-related nuclear cataract, bilateral: Secondary | ICD-10-CM | POA: Diagnosis not present

## 2022-08-05 DIAGNOSIS — H5213 Myopia, bilateral: Secondary | ICD-10-CM | POA: Diagnosis not present

## 2022-08-05 DIAGNOSIS — H35412 Lattice degeneration of retina, left eye: Secondary | ICD-10-CM | POA: Diagnosis not present

## 2022-08-26 DIAGNOSIS — Z0100 Encounter for examination of eyes and vision without abnormal findings: Secondary | ICD-10-CM | POA: Diagnosis not present

## 2022-09-29 DIAGNOSIS — L570 Actinic keratosis: Secondary | ICD-10-CM | POA: Diagnosis not present

## 2022-09-29 DIAGNOSIS — L578 Other skin changes due to chronic exposure to nonionizing radiation: Secondary | ICD-10-CM | POA: Diagnosis not present

## 2022-09-29 DIAGNOSIS — L814 Other melanin hyperpigmentation: Secondary | ICD-10-CM | POA: Diagnosis not present

## 2022-09-29 DIAGNOSIS — Z09 Encounter for follow-up examination after completed treatment for conditions other than malignant neoplasm: Secondary | ICD-10-CM | POA: Diagnosis not present

## 2022-09-29 DIAGNOSIS — L918 Other hypertrophic disorders of the skin: Secondary | ICD-10-CM | POA: Diagnosis not present

## 2022-09-29 DIAGNOSIS — D225 Melanocytic nevi of trunk: Secondary | ICD-10-CM | POA: Diagnosis not present

## 2022-09-29 DIAGNOSIS — L821 Other seborrheic keratosis: Secondary | ICD-10-CM | POA: Diagnosis not present

## 2022-10-04 DIAGNOSIS — G43019 Migraine without aura, intractable, without status migrainosus: Secondary | ICD-10-CM | POA: Diagnosis not present

## 2022-11-15 DIAGNOSIS — H1031 Unspecified acute conjunctivitis, right eye: Secondary | ICD-10-CM | POA: Diagnosis not present

## 2022-11-20 DIAGNOSIS — J208 Acute bronchitis due to other specified organisms: Secondary | ICD-10-CM | POA: Diagnosis not present

## 2023-01-05 DIAGNOSIS — R079 Chest pain, unspecified: Secondary | ICD-10-CM | POA: Diagnosis not present

## 2023-01-12 ENCOUNTER — Encounter: Payer: Self-pay | Admitting: Gastroenterology

## 2023-02-02 ENCOUNTER — Telehealth: Payer: Self-pay

## 2023-02-02 NOTE — Telephone Encounter (Signed)
Called and LM for patient that Dr. Adela Lank has changed his recall colon to 02-2025. He is currently scheduled for 03-15-23 but does not need to have this year due to new guidelines. Asked him to call back to discuss. We can cancel this procedure and add a recall for 02-2025.

## 2023-02-03 ENCOUNTER — Encounter: Payer: Self-pay | Admitting: Gastroenterology

## 2023-02-03 NOTE — Telephone Encounter (Signed)
Patient called back to confirm message received. Will call for recall 2026.

## 2023-02-03 NOTE — Telephone Encounter (Signed)
Appointment for previsit and colonoscopy were cancelled.  Recall for colonoscopy entered for 02/2025.

## 2023-03-15 ENCOUNTER — Encounter: Payer: 59 | Admitting: Gastroenterology

## 2023-03-31 DIAGNOSIS — L820 Inflamed seborrheic keratosis: Secondary | ICD-10-CM | POA: Diagnosis not present

## 2023-03-31 DIAGNOSIS — L821 Other seborrheic keratosis: Secondary | ICD-10-CM | POA: Diagnosis not present

## 2023-03-31 DIAGNOSIS — Z09 Encounter for follow-up examination after completed treatment for conditions other than malignant neoplasm: Secondary | ICD-10-CM | POA: Diagnosis not present

## 2023-03-31 DIAGNOSIS — L570 Actinic keratosis: Secondary | ICD-10-CM | POA: Diagnosis not present

## 2023-06-06 DIAGNOSIS — E785 Hyperlipidemia, unspecified: Secondary | ICD-10-CM | POA: Diagnosis not present

## 2023-06-06 DIAGNOSIS — R7309 Other abnormal glucose: Secondary | ICD-10-CM | POA: Diagnosis not present

## 2023-06-06 DIAGNOSIS — I10 Essential (primary) hypertension: Secondary | ICD-10-CM | POA: Diagnosis not present

## 2023-06-06 DIAGNOSIS — Z125 Encounter for screening for malignant neoplasm of prostate: Secondary | ICD-10-CM | POA: Diagnosis not present

## 2023-06-14 DIAGNOSIS — R7309 Other abnormal glucose: Secondary | ICD-10-CM | POA: Diagnosis not present

## 2023-06-14 DIAGNOSIS — Z23 Encounter for immunization: Secondary | ICD-10-CM | POA: Diagnosis not present

## 2023-06-14 DIAGNOSIS — I10 Essential (primary) hypertension: Secondary | ICD-10-CM | POA: Diagnosis not present

## 2023-06-14 DIAGNOSIS — Z125 Encounter for screening for malignant neoplasm of prostate: Secondary | ICD-10-CM | POA: Diagnosis not present

## 2023-06-14 DIAGNOSIS — Z87442 Personal history of urinary calculi: Secondary | ICD-10-CM | POA: Diagnosis not present

## 2023-06-14 DIAGNOSIS — Z0000 Encounter for general adult medical examination without abnormal findings: Secondary | ICD-10-CM | POA: Diagnosis not present

## 2023-06-14 DIAGNOSIS — E785 Hyperlipidemia, unspecified: Secondary | ICD-10-CM | POA: Diagnosis not present

## 2023-08-08 DIAGNOSIS — H5213 Myopia, bilateral: Secondary | ICD-10-CM | POA: Diagnosis not present

## 2023-08-08 DIAGNOSIS — H2513 Age-related nuclear cataract, bilateral: Secondary | ICD-10-CM | POA: Diagnosis not present

## 2023-08-18 DIAGNOSIS — R69 Illness, unspecified: Secondary | ICD-10-CM | POA: Diagnosis not present

## 2023-09-09 DIAGNOSIS — J019 Acute sinusitis, unspecified: Secondary | ICD-10-CM | POA: Diagnosis not present

## 2023-09-09 DIAGNOSIS — Z03818 Encounter for observation for suspected exposure to other biological agents ruled out: Secondary | ICD-10-CM | POA: Diagnosis not present

## 2023-09-09 DIAGNOSIS — R058 Other specified cough: Secondary | ICD-10-CM | POA: Diagnosis not present

## 2023-09-09 DIAGNOSIS — H1089 Other conjunctivitis: Secondary | ICD-10-CM | POA: Diagnosis not present

## 2023-09-09 DIAGNOSIS — R0981 Nasal congestion: Secondary | ICD-10-CM | POA: Diagnosis not present

## 2023-10-06 DIAGNOSIS — D225 Melanocytic nevi of trunk: Secondary | ICD-10-CM | POA: Diagnosis not present

## 2023-10-06 DIAGNOSIS — L814 Other melanin hyperpigmentation: Secondary | ICD-10-CM | POA: Diagnosis not present

## 2023-10-06 DIAGNOSIS — L820 Inflamed seborrheic keratosis: Secondary | ICD-10-CM | POA: Diagnosis not present

## 2023-10-06 DIAGNOSIS — L538 Other specified erythematous conditions: Secondary | ICD-10-CM | POA: Diagnosis not present

## 2023-10-06 DIAGNOSIS — L821 Other seborrheic keratosis: Secondary | ICD-10-CM | POA: Diagnosis not present

## 2023-10-06 DIAGNOSIS — L570 Actinic keratosis: Secondary | ICD-10-CM | POA: Diagnosis not present

## 2023-10-12 DIAGNOSIS — G43019 Migraine without aura, intractable, without status migrainosus: Secondary | ICD-10-CM | POA: Diagnosis not present

## 2023-11-14 DIAGNOSIS — Z822 Family history of deafness and hearing loss: Secondary | ICD-10-CM | POA: Diagnosis not present

## 2023-11-14 DIAGNOSIS — Z1589 Genetic susceptibility to other disease: Secondary | ICD-10-CM | POA: Diagnosis not present

## 2023-11-14 DIAGNOSIS — Z1371 Encounter for nonprocreative screening for genetic disease carrier status: Secondary | ICD-10-CM | POA: Diagnosis not present

## 2023-11-14 DIAGNOSIS — Z8481 Family history of carrier of genetic disease: Secondary | ICD-10-CM | POA: Diagnosis not present

## 2023-11-14 DIAGNOSIS — H93012 Transient ischemic deafness, left ear: Secondary | ICD-10-CM | POA: Diagnosis not present

## 2023-12-27 ENCOUNTER — Emergency Department (HOSPITAL_BASED_OUTPATIENT_CLINIC_OR_DEPARTMENT_OTHER)
Admission: EM | Admit: 2023-12-27 | Discharge: 2023-12-27 | Disposition: A | Discharge status: Home / Self Care | Attending: Emergency Medicine | Admitting: Emergency Medicine

## 2023-12-27 ENCOUNTER — Other Ambulatory Visit: Payer: Self-pay

## 2023-12-27 ENCOUNTER — Emergency Department (HOSPITAL_BASED_OUTPATIENT_CLINIC_OR_DEPARTMENT_OTHER)

## 2023-12-27 ENCOUNTER — Encounter (HOSPITAL_BASED_OUTPATIENT_CLINIC_OR_DEPARTMENT_OTHER): Payer: Self-pay | Admitting: Emergency Medicine

## 2023-12-27 DIAGNOSIS — N134 Hydroureter: Secondary | ICD-10-CM | POA: Diagnosis not present

## 2023-12-27 DIAGNOSIS — N132 Hydronephrosis with renal and ureteral calculous obstruction: Secondary | ICD-10-CM | POA: Diagnosis not present

## 2023-12-27 DIAGNOSIS — N201 Calculus of ureter: Secondary | ICD-10-CM

## 2023-12-27 DIAGNOSIS — R339 Retention of urine, unspecified: Secondary | ICD-10-CM | POA: Diagnosis not present

## 2023-12-27 DIAGNOSIS — I10 Essential (primary) hypertension: Secondary | ICD-10-CM | POA: Insufficient documentation

## 2023-12-27 DIAGNOSIS — N400 Enlarged prostate without lower urinary tract symptoms: Secondary | ICD-10-CM | POA: Diagnosis not present

## 2023-12-27 DIAGNOSIS — R1030 Lower abdominal pain, unspecified: Secondary | ICD-10-CM | POA: Diagnosis not present

## 2023-12-27 DIAGNOSIS — Z79899 Other long term (current) drug therapy: Secondary | ICD-10-CM | POA: Insufficient documentation

## 2023-12-27 DIAGNOSIS — R39198 Other difficulties with micturition: Secondary | ICD-10-CM

## 2023-12-27 LAB — CBC
HCT: 40.6 % (ref 39.0–52.0)
Hemoglobin: 14.3 g/dL (ref 13.0–17.0)
MCH: 32.3 pg (ref 26.0–34.0)
MCHC: 35.2 g/dL (ref 30.0–36.0)
MCV: 91.6 fL (ref 80.0–100.0)
Platelets: 194 10*3/uL (ref 150–400)
RBC: 4.43 MIL/uL (ref 4.22–5.81)
RDW: 12.3 % (ref 11.5–15.5)
WBC: 8.1 10*3/uL (ref 4.0–10.5)
nRBC: 0 % (ref 0.0–0.2)

## 2023-12-27 LAB — URINALYSIS, ROUTINE W REFLEX MICROSCOPIC
Bacteria, UA: NONE SEEN
Bilirubin Urine: NEGATIVE
Glucose, UA: NEGATIVE mg/dL
Ketones, ur: NEGATIVE mg/dL
Leukocytes,Ua: NEGATIVE
Nitrite: NEGATIVE
Protein, ur: NEGATIVE mg/dL
Specific Gravity, Urine: 1.009 (ref 1.005–1.030)
pH: 6.5 (ref 5.0–8.0)

## 2023-12-27 LAB — BASIC METABOLIC PANEL
Anion gap: 8 (ref 5–15)
BUN: 15 mg/dL (ref 8–23)
CO2: 27 mmol/L (ref 22–32)
Calcium: 9.8 mg/dL (ref 8.9–10.3)
Chloride: 100 mmol/L (ref 98–111)
Creatinine, Ser: 0.91 mg/dL (ref 0.61–1.24)
GFR, Estimated: >60 mL/min (ref >60)
Glucose, Bld: 104 mg/dL — ABNORMAL HIGH (ref 70–99)
Potassium: 3.5 mmol/L (ref 3.5–5.1)
Sodium: 135 mmol/L (ref 135–145)

## 2023-12-27 NOTE — ED Provider Notes (Addendum)
 [place] EMERGENCY DEPARTMENT AT [facility] Provider Note   CSN: [id number] Arrival date & time: 12/27/23  1456     History  Chief Complaint  Patient presents with   Flank Pain    Austin Lane is a 68 y.o. male.  Patient with a complaint of sort of penile pain and feels like he is having difficulty voiding has to strain to void.  Has a history of kidney stones but none recently but on Sunday he did pass kidney stone.  And since that time he had discomfort in the penis and some difficulty voiding.  No nausea no vomiting.  Temp here is 98.7 pulse 87 respiration 17 blood pressure 141/84 oxygen sats 97%.  Past medical history sniffer hypertension hyperlipidemia history of kidney stones and right inguinal hernia.  Patient had umbilical hernia repair in 22 and inguinal hernia repair in 22.  Patient is never used tobacco products.  Patient denies any testicular pain to palpation.  No blood in the urine.       Home Medications Prior to Admission medications   Medication Sig Start Date End Date Taking? Authorizing Provider  gabapentin (NEURONTIN) 100 MG capsule Take 100 mg by mouth every other day. Takes qod at hs    [provider]  olmesartan-hydrochlorothiazide (BENICAR HCT) 40-25 MG per tablet Take 1 tablet by mouth daily. Takes 1/2 pill daily    [provider]  omega-3 acid ethyl esters (LOVAZA) 1 G capsule Take 1 g by mouth daily. OTC    [provider]  potassium citrate (UROCIT-K) 10 MEQ (1080 MG) SR tablet Take 10 mEq by mouth 3 (three) times daily with meals. 540mg     [provider]  traMADol (ULTRAM) 50 MG tablet Take 1-2 tablets (50-100 mg total) by mouth every 6 (six) hours as needed for moderate pain or severe pain. 11/26/20   Karie Soda, MD      Allergies    Patient has no known allergies.    Review of Systems   Review of Systems  Constitutional:  Negative for chills and fever.  HENT:  Negative for ear pain and sore  throat.   Eyes:  Negative for pain and visual disturbance.  Respiratory:  Negative for cough and shortness of breath.   Cardiovascular:  Negative for chest pain and palpitations.  Gastrointestinal:  Negative for abdominal pain and vomiting.  Genitourinary:  Positive for difficulty urinating, flank pain and penile pain. Negative for dysuria and hematuria.  Musculoskeletal:  Negative for arthralgias and back pain.  Skin:  Negative for color change and rash.  Neurological:  Negative for seizures and syncope.  All other systems reviewed and are negative.   Physical Exam Updated Vital Signs BP 139/88   Pulse 70   Temp 98.9 F (37.2 C)   Resp 18   SpO2 99%  Physical Exam Vitals and nursing note reviewed.  Constitutional:      General: He is not in acute distress.    Appearance: Normal appearance. He is well-developed.  HENT:     Head: Normocephalic and atraumatic.     Mouth/Throat:     Mouth: Mucous membranes are moist.  Eyes:     Extraocular Movements: Extraocular movements intact.     Conjunctiva/sclera: Conjunctivae normal.     Pupils: Pupils are equal, round, and reactive to light.  Cardiovascular:     Rate and Rhythm: Normal rate and regular rhythm.     Heart sounds: No murmur heard. Pulmonary:  Effort: Pulmonary effort is normal. No respiratory distress.     Breath sounds: Normal breath sounds.  Abdominal:     Palpations: Abdomen is soft.     Tenderness: There is no abdominal tenderness.  Genitourinary:    Penis: Normal.   Musculoskeletal:        General: No swelling.     Cervical back: Normal range of motion and neck supple.  Skin:    General: Skin is warm and dry.     Capillary Refill: Capillary refill takes less than 2 seconds.  Neurological:     General: No focal deficit present.     Mental Status: He is alert and oriented to person, place, and time.  Psychiatric:        Mood and Affect: Mood normal.     ED Results / Procedures / Treatments    Labs (all labs ordered are listed, but only abnormal results are displayed) Labs Reviewed  URINALYSIS, ROUTINE W REFLEX MICROSCOPIC - Abnormal; Notable for the following components:      Result Value   Hgb urine dipstick SMALL (*)    All other components within normal limits  BASIC METABOLIC PANEL - Abnormal; Notable for the following components:   Glucose, Bld 104 (*)    All other components within normal limits  CBC    EKG None  Radiology CT Renal Stone Study Result Date: 12/27/2023 CLINICAL DATA:  Low abdomen pain and flank pain EXAM: CT ABDOMEN AND PELVIS WITHOUT CONTRAST TECHNIQUE: Multidetector CT imaging of the abdomen and pelvis was performed following the standard protocol without IV contrast. RADIATION DOSE REDUCTION: This exam was performed according to the departmental dose-optimization program which includes automated exposure control, adjustment of the mA and/or kV according to patient size and/or use of iterative reconstruction technique. COMPARISON:  CT 11/19/2012 FINDINGS: Lower chest: Lung bases are clear. 5 mm subpleural right lower lobe pulmonary nodule, series 4, image 5 Hepatobiliary: No focal liver abnormality is seen. Status post cholecystectomy. No biliary dilatation. Pancreas: Unremarkable. No pancreatic ductal dilatation or surrounding inflammatory changes. Spleen: Normal in size without focal abnormality. Adrenals/Urinary Tract: Adrenal glands are normal. Low-density lesions in the kidneys probably cysts, no specific imaging follow-up is recommended. Bulk to pole bilateral intrarenal stones, largest on the left is at the lower pole and measures 6 mm. Mild to moderate right-sided hydronephrosis and hydroureter, secondary to a 10 x 7 mm proximal ureteral stone at the level of L3-L4. Bladder is distended Stomach/Bowel: Stomach is within normal limits. Appendix appears normal. No evidence of bowel wall thickening, distention, or inflammatory changes. Vascular/Lymphatic:  Aortic atherosclerosis. No enlarged abdominal or pelvic lymph nodes. Reproductive: Enlarged prostate. 9 mm calcification in prostate gland. Other: Negative for pelvic effusion or free air Musculoskeletal: No acute or suspicious osseous abnormality IMPRESSION: 1. Mild to moderate right-sided hydronephrosis and hydroureter, secondary to a 10 x 7 mm proximal ureteral stone at the level of L3-L4. 9 mm calcification within the prostate gland, difficult to exclude prostatic urethral stone 2. Additional bilateral intrarenal stones. 3. Enlarged prostate. Distended bladder. 4. 5 mm right lower lobe pulmonary nodule. No follow-up needed if patient is low-risk.This recommendation follows the consensus statement: Guidelines for Management of Incidental Pulmonary Nodules Detected on CT Images: From the Fleischner Society 2017; Radiology 2017; 284:228-243. Electronically Signed   By: Jasmine Pang M.D.   On: 12/27/2023 19:58    Procedures Procedures    Medications Ordered in ED Medications - No data to display  ED Course/ Medical  Decision Making/ A&P                                 Medical Decision Making Amount and/or Complexity of Data Reviewed Labs: ordered.  Patient will need CT renal study and then we will see what we get on that we will do a bladder scan because CT results are taking a long time due to radiology shortage.  Just make sure that does not have significant bladder retention.  Patient bladder questionably a little enlarged but cannot tell for sure.  Clearly he is voiding is voided 4 times here.  CBC normal basic metabolic panel normal renal function very normal all reassuring urinalysis did have 11-20 red blood cells.  But no other significant abnormalities.  Patient with large right ureteral stone and questionable stone also lodged in the process attic part of the urethra.  Will discuss with urology.  Renal function is normal.  Patient did have urinary retention to some degree with about  600 cc of based on bladder scan.  Success in placing the coud Foley catheter.  Large amount of urine came out.  Will switch him to a leg bag.  Urology will be contacting him for follow-up.  Urology recommended an 41 [place] coud which went in fine.  This as long as we are able to relieve the immediate obstruction they would take it from there probably will need surgical intervention to deal with the stones.  Patient feeling much more comfortable.  Final Clinical Impression(s) / ED Diagnoses Final diagnoses:  Difficulty voiding  Urinary retention  Right ureteral stone    Rx / DC Orders ED Discharge Orders     None         Vanetta Mulders, MD 12/27/23 Avanell Shackleton    Vanetta Mulders, MD 12/27/23 2019    Vanetta Mulders, MD 12/27/23 2149

## 2023-12-27 NOTE — ED Notes (Signed)
 Coude placed by [employer] Psychologist, occupational)

## 2023-12-27 NOTE — ED Provider Triage Note (Signed)
 Emergency Medicine Provider Triage Evaluation Note  Austin Lane , a 68 y.o. male  was evaluated in triage.  Pt complains of penile pain.  States he has had 44 kidney stones in the past.  Felt like he passed a kidney stone last night.  He has had pain in his urethra today.  He denies flank pain.  No fevers or chills.  No nausea or vomiting.  Review of Systems  Positive: As above Negative: As above  Physical Exam  BP (!) 141/84 (BP Location: Right Arm)   Pulse 87   Temp 98.7 F (37.1 C)   Resp 17   SpO2 97%  Gen:   Awake, no distress   Resp:  Normal effort  MSK:   Moves extremities without difficulty  Other:    Medical Decision Making  Medically screening exam initiated at 4:14 PM.  Appropriate orders placed.  ARLIE RIKER was informed that the remainder of the evaluation will be completed by another provider, this initial triage assessment does not replace that evaluation, and the importance of remaining in the ED until their evaluation is complete.  Workup initiated   Michelle Piper, Cordelia Poche 12/27/23 1615

## 2023-12-27 NOTE — ED Triage Notes (Signed)
 C/o lower abd and R flank pain. Hx of kidney stones. States passed one on Sunday at home but still having pain in penis.

## 2023-12-27 NOTE — Discharge Instructions (Addendum)
 Keep the Foley catheter in place should be switched over to a leg bag.  Empty when it is full.  Expect a call from urology in the next 1 or 2 days or you should call them.  Return for any new or worse symptoms.

## 2024-01-01 ENCOUNTER — Other Ambulatory Visit: Payer: Self-pay | Admitting: Urology

## 2024-01-03 NOTE — Patient Instructions (Signed)
 SURGICAL WAITING ROOM VISITATION  Patients having surgery or a procedure may have no more than 2 support people in the waiting area - these visitors may rotate.    Children under the age of 49 must have an adult with them who is not the patient.  Due to an increase in RSV and influenza rates and associated hospitalizations, children ages 9 and under may not visit patients in [facility] hospitals.  Visitors with respiratory illnesses are discouraged from visiting and should remain at home.  If the patient needs to stay at the hospital during part of their recovery, the visitor guidelines for inpatient rooms apply. Pre-op nurse will coordinate an appropriate time for 1 support person to accompany patient in pre-op.  This support person may not rotate.    Please refer to the [facility] website for the visitor guidelines for Inpatients (after your surgery is over and you are in a regular room).       Your procedure is scheduled on:  01/09/24    Report to [facility] Main Entrance    Report to admitting at   1030 AM   Call this number if you have problems the morning of surgery [phone]   Do not eat food  or drink liquids :After Midnight.               If you have questions, please contact your surgeon's office.      Oral Hygiene is also important to reduce your risk of infection.                                    Remember - BRUSH YOUR TEETH THE MORNING OF SURGERY WITH YOUR REGULAR TOOTHPASTE  DENTURES WILL BE REMOVED PRIOR TO SURGERY PLEASE DO NOT APPLY "Poly grip" OR ADHESIVES!!!   Do NOT smoke after Midnight   Stop all vitamins and herbal supplements 7 days before surgery.   Take these medicines the morning of surgery with A SIP OF WATER:  urocit k  DO NOT TAKE ANY ORAL DIABETIC MEDICATIONS DAY OF YOUR SURGERY  Bring CPAP mask and tubing day of surgery.                              You may not have any metal on your body including hair pins, jewelry, and  body piercing             Do not wear make-up, lotions, powders, perfumes/cologne, or deodorant  Do not wear nail polish including gel and S&S, artificial/acrylic nails, or any other type of covering on natural nails including finger and toenails. If you have artificial nails, gel coating, etc. that needs to be removed by a nail salon please have this removed prior to surgery or surgery may need to be canceled/ delayed if the surgeon/ anesthesia feels like they are unable to be safely monitored.   Do not shave  48 hours prior to surgery.               Men may shave face and neck.   Do not bring valuables to the hospital. [facility] IS NOT             RESPONSIBLE   FOR VALUABLES.   Contacts, glasses, dentures or bridgework may not be worn into surgery.   Bring small overnight bag day of surgery.  DO NOT BRING YOUR HOME MEDICATIONS TO THE HOSPITAL. PHARMACY WILL DISPENSE MEDICATIONS LISTED ON YOUR MEDICATION LIST TO YOU DURING YOUR ADMISSION IN THE HOSPITAL!    Patients discharged on the day of surgery will not be allowed to drive home.  Someone NEEDS to stay with you for the first 24 hours after anesthesia.   Special Instructions: Bring a copy of your healthcare power of attorney and living will documents the day of surgery if you haven't scanned them before.              Please read over the following fact sheets you were given: IF YOU HAVE QUESTIONS ABOUT YOUR PRE-OP INSTRUCTIONS PLEASE CALL [phone]   If you received a COVID test during your pre-op visit  it is requested that you wear a mask when out in public, stay away from anyone that may not be feeling well and notify your surgeon if you develop symptoms. If you test positive for Covid or have been in contact with anyone that has tested positive in the last 10 days please notify you surgeon.    [facility] - Preparing for Surgery Before surgery, you can play an important role.  Because skin is not sterile, your skin needs to  be as free of germs as possible.  You can reduce the number of germs on your skin by washing with CHG (chlorahexidine gluconate) soap before surgery.  CHG is an antiseptic cleaner which kills germs and bonds with the skin to continue killing germs even after washing. Please DO NOT use if you have an allergy to CHG or antibacterial soaps.  If your skin becomes reddened/irritated stop using the CHG and inform your nurse when you arrive at Short Stay. Do not shave (including legs and underarms) for at least 48 hours prior to the first CHG shower.  You may shave your face/neck. Please follow these instructions carefully:  1.  Shower with CHG Soap the night before surgery and the  morning of Surgery.  2.  If you choose to wash your hair, wash your hair first as usual with your  normal  shampoo.  3.  After you shampoo, rinse your hair and body thoroughly to remove the  shampoo.                           4.  Use CHG as you would any other liquid soap.  You can apply chg directly  to the skin and wash                       Gently with a scrungie or clean washcloth.  5.  Apply the CHG Soap to your body ONLY FROM THE NECK DOWN.   Do not use on face/ open                           Wound or open sores. Avoid contact with eyes, ears mouth and genitals (private parts).                       Wash face,  Genitals (private parts) with your normal soap.             6.  Wash thoroughly, paying special attention to the area where your surgery  will be performed.  7.  Thoroughly rinse your body with warm water from the neck down.  8.  DO NOT shower/wash with your normal soap after using and rinsing off  the CHG Soap.                9.  Pat yourself dry with a clean towel.            10.  Wear clean pajamas.            11.  Place clean sheets on your bed the night of your first shower and do not  sleep with pets. Day of Surgery : Do not apply any lotions/deodorants the morning of surgery.  Please wear clean clothes to  the hospital/surgery center.  FAILURE TO FOLLOW THESE INSTRUCTIONS MAY RESULT IN THE CANCELLATION OF YOUR SURGERY PATIENT SIGNATURE_________________________________  NURSE SIGNATURE__________________________________  ________________________________________________________________________

## 2024-01-05 DIAGNOSIS — N200 Calculus of kidney: Secondary | ICD-10-CM | POA: Diagnosis not present

## 2024-01-05 DIAGNOSIS — N210 Calculus in bladder: Secondary | ICD-10-CM | POA: Diagnosis not present

## 2024-01-07 NOTE — H&P (Signed)
 68 year old male presenting by Dr. Eppie Gibson who presented to ER on 12/27/23 for right proximal ureteral stone here today for preoperative evaluation.He was last in 2014 for a right distal ureteral stone he did pass a stone without complication.   PMH:No Cards Denies, no PUlm hx, no blood thinners  PSH: L and R inguinal hernia last 2022   Nephrolithiasis:  01/05/2024: No left pain, right pain is minimal, passed stone on March 9th. No N/V/F/C. Having some blood in urine. Longe rhx of stones. Had stone since teen years.  01/09/24:here to[place]ay for URS and cystolithopaxy BPH:  01/05/24: drinks a lot of water, has nocturia and urgency.     ALLERGIES: No Allergies    MEDICATIONS: Aspirin EC 81 MG Oral Tablet Delayed Release Oral  Benicar HCT 40-25 MG Tablet Oral  Cephalexin 500 MG Capsule Oral  Fish Oil CAPS Oral  Gabapentin 600 MG Tablet Oral  Ondansetron 4 MG Tablet Disintegrating 0 Oral  Oxycodone-Acetaminophen 5-325 MG Oral Tablet 0 Oral  Potassium Citrate ER TBCR Oral     GU PSH: ESWL - 2014       PSH Notes: Lithotripsy, Cholecystectomy, Hernia Repair   NON-GU PSH: Cholecystectomy (open) - 2011 Hernia Repair - 2011     GU PMH: Other microscopic hematuria, Microscopic hematuria - 2014 Renal calculus, Kidney stone on left side - 2014 Renal cyst, Renal cyst, acquired, left - 2014 Ureteral calculus, Distal Ureteral Stone On The Right - 2014 Urinary Tract Inf, Unspec site, Urinary tract infection - 2014    NON-GU PMH: Personal history of other diseases of the circulatory system, History of hypertension - 2014 Encounter for general adult medical examination without abnormal findings, Encounter for preventive health examination    FAMILY HISTORY: Cardiac Failure - Brother Death In The Family Father - Runs In Family Death In The Family Mother - Runs In Family Family Health Status Number - Runs In Family Heart Disease - Runs In Family Hypertension - Mother nephrolithiasis -  Father Prostate Cancer - Father   SOCIAL HISTORY: No Social History     Notes: Alcohol Use, Never A Smoker, Caffeine Use, Occupation:, Marital History - Currently Married   REVIEW OF SYSTEMS:    GU Review Male:   Patient denies frequent urination, hard to postpone urination, burning/ pain with urination, get up at night to urinate, leakage of urine, stream starts and stops, trouble starting your stream, have to strain to urinate , erection problems, and penile pain.  Gastrointestinal (Upper):   Patient denies nausea, vomiting, and indigestion/ heartburn.  Gastrointestinal (Lower):   Patient denies diarrhea and constipation.  Constitutional:   Patient denies fever, night sweats, weight loss, and fatigue.  Skin:   Patient denies skin rash/ lesion and itching.  Eyes:   Patient denies blurred vision and double vision.  Ears/ Nose/ Throat:   Patient denies sore throat and sinus problems.  Hematologic/Lymphatic:   Patient denies swollen glands and easy bruising.  Cardiovascular:   Patient denies leg swelling and chest pains.  Respiratory:   Patient denies cough and shortness of breath.  Endocrine:   Patient denies excessive thirst.  Musculoskeletal:   Patient denies back pain and joint pain.  Neurological:   Patient denies headaches and dizziness.  Psychologic:   Patient denies depression and anxiety.   VITAL SIGNS: None   GU PHYSICAL EXAMINATION:    Penis: Catheter in place, Impella position draining light pink urine. Penis circumcised   MULTI-SYSTEM PHYSICAL EXAMINATION:    Constitutional: Well-nourished. No  physical deformities. Normally developed. Good grooming.  Respiratory: No labored breathing, no use of accessory muscles.   Cardiovascular: Normal temperature, normal extremity pulses, no swelling, no varicosities.  Neurologic / Psychiatric: Oriented to time, oriented to place, oriented to person. No depression, no anxiety, no agitation.  Musculoskeletal: Normal gait and station of  head and neck.     Complexity of Data:  Urine Test Review:   Urinalysis  X-Ray Review: C.T. Abdomen/Pelvis: Reviewed Films. Reviewed Report. Prostatic urethral stone, right UPJ stone 1 cm in size, left sided nonobstructing stones. Prostatomegaly noted.    PROCEDURES:          Visit Complexity - G2211    ASSESSMENT:      ICD-10 Details  1 GU:   Renal calculus - N20.0 Undiagnosed New Problem  2   Bladder Stone - N21.0 Undiagnosed New Problem   PLAN:            Medications New Meds: Tamsulosin HCl 0.4 MG Capsule 1 capsule PO Daily   #30  0 Refill(s)  Pharmacy Name:  [facility] [phone]  Address:  [address]   [place], [place] [address]  Phone:  [phone]  Fax:  [phone]            Orders Labs Urine Culture          Document Letter(s):  Created for Patient: Clinical Summary         Notes:   Nephrolithiasis: Has bladder stone and ureteral stone we discussed removing the bladder stone ureteral stone today patient is agreement we will get urine culture today.   PT on Flomax PT has been on Cephalexin prior to procedure.  We discussed the risk benefits and alternatives to ureteroscopy. This includes bleeding, infection, damage to surrounding structures including the urethra, bladder, ureter, and kidney. With these possible injuries resulting in need for intervention in the future. We discussed inability to remove all the stone and requiring follow-up ureteroscopy. We also discussed the possibility of not being able to gain access to the kidney and the need for nephrostomy tube. Possibility of long-term stent was also discussed. The patient voiced their understanding and would like to proceed.   Plan to proceed with URS and cystolithopaxy today

## 2024-01-08 ENCOUNTER — Encounter (HOSPITAL_COMMUNITY)
Admission: RE | Admit: 2024-01-08 | Discharge: 2024-01-08 | Disposition: A | Discharge status: Home / Self Care | Source: Ambulatory Visit | Attending: Urology | Admitting: Urology

## 2024-01-08 ENCOUNTER — Other Ambulatory Visit: Payer: Self-pay

## 2024-01-08 ENCOUNTER — Encounter (HOSPITAL_COMMUNITY): Payer: Self-pay

## 2024-01-08 VITALS — Ht 71.0 in | Wt 211.0 lb

## 2024-01-08 DIAGNOSIS — Z01818 Encounter for other preprocedural examination: Secondary | ICD-10-CM

## 2024-01-08 NOTE — Progress Notes (Addendum)
 Anesthesia Review:  PCP: Farris Has with [facility] 06/14/23.   Cardiologist : none   PPM/ ICD: Device Orders: Rep Notified:  Chest x-ray : EKG : Echo : Stress test: Cardiac Cath :   Activity levelcan do a flight of stairs without difficutly   Sleep Study/ CPAP : none  Fasting Blood Sugar :      / Checks Blood Sugar -- times a day:    Blood Thinner/ Instructions /Last Dose: ASA / Instructions/ Last Dose :    Has catheter in place    Proep phone appt completed on 01/08/24 at 1500.  Med hx and preop instructions completed with pt.  PT instructed to call Admitting at end of phone call at [phone].  PT instrucdted to use Dial soap ( antibacterial ) for shower nite before and am of surgery.

## 2024-01-09 ENCOUNTER — Encounter (HOSPITAL_COMMUNITY): Payer: Self-pay | Admitting: Urology

## 2024-01-09 ENCOUNTER — Ambulatory Visit (HOSPITAL_BASED_OUTPATIENT_CLINIC_OR_DEPARTMENT_OTHER): Payer: Self-pay | Admitting: Anesthesiology

## 2024-01-09 ENCOUNTER — Ambulatory Visit (HOSPITAL_COMMUNITY)
Admission: RE | Admit: 2024-01-09 | Discharge: 2024-01-09 | Disposition: A | Discharge status: Home / Self Care | Source: Ambulatory Visit | Attending: Urology | Admitting: Urology

## 2024-01-09 ENCOUNTER — Ambulatory Visit (HOSPITAL_COMMUNITY)

## 2024-01-09 ENCOUNTER — Ambulatory Visit (HOSPITAL_COMMUNITY): Payer: Self-pay | Admitting: Anesthesiology

## 2024-01-09 ENCOUNTER — Encounter (HOSPITAL_COMMUNITY)
Admission: RE | Disposition: A | Discharge status: Home / Self Care | Payer: Self-pay | Source: Ambulatory Visit | Attending: Urology

## 2024-01-09 DIAGNOSIS — I10 Essential (primary) hypertension: Secondary | ICD-10-CM

## 2024-01-09 DIAGNOSIS — N201 Calculus of ureter: Secondary | ICD-10-CM | POA: Diagnosis not present

## 2024-01-09 DIAGNOSIS — R351 Nocturia: Secondary | ICD-10-CM | POA: Insufficient documentation

## 2024-01-09 DIAGNOSIS — E7800 Pure hypercholesterolemia, unspecified: Secondary | ICD-10-CM

## 2024-01-09 DIAGNOSIS — Z79899 Other long term (current) drug therapy: Secondary | ICD-10-CM | POA: Insufficient documentation

## 2024-01-09 DIAGNOSIS — R3915 Urgency of urination: Secondary | ICD-10-CM | POA: Insufficient documentation

## 2024-01-09 DIAGNOSIS — N210 Calculus in bladder: Secondary | ICD-10-CM | POA: Diagnosis not present

## 2024-01-09 DIAGNOSIS — Z01818 Encounter for other preprocedural examination: Secondary | ICD-10-CM

## 2024-01-09 DIAGNOSIS — N202 Calculus of kidney with calculus of ureter: Secondary | ICD-10-CM | POA: Diagnosis not present

## 2024-01-09 DIAGNOSIS — N401 Enlarged prostate with lower urinary tract symptoms: Secondary | ICD-10-CM | POA: Insufficient documentation

## 2024-01-09 HISTORY — PX: CYSTOSCOPY/URETEROSCOPY/HOLMIUM LASER/STENT PLACEMENT: SHX6546

## 2024-01-09 SURGERY — CYSTOSCOPY/URETEROSCOPY/HOLMIUM LASER/STENT PLACEMENT
Anesthesia: General | Laterality: Right

## 2024-01-09 MED ORDER — ONDANSETRON HCL 4 MG/2ML IJ SOLN
INTRAMUSCULAR | Status: AC
  Filled 2024-01-09: qty 2

## 2024-01-09 MED ORDER — MIDAZOLAM HCL 2 MG/2ML IJ SOLN
INTRAMUSCULAR | Status: AC
  Filled 2024-01-09: qty 2

## 2024-01-09 MED ORDER — PROPOFOL 10 MG/ML IV BOLUS
INTRAVENOUS | Status: AC
  Filled 2024-01-09: qty 20

## 2024-01-09 MED ORDER — PHENYLEPHRINE 80 MCG/ML (10ML) SYRINGE FOR IV PUSH (FOR BLOOD PRESSURE SUPPORT)
PREFILLED_SYRINGE | INTRAVENOUS | Status: DC | PRN
  Administered 2024-01-09 (×5): 80 ug via INTRAVENOUS

## 2024-01-09 MED ORDER — PROPOFOL 10 MG/ML IV BOLUS
INTRAVENOUS | Status: DC | PRN
  Administered 2024-01-09: 200 mg via INTRAVENOUS
  Administered 2024-01-09 (×2): 50 mg via INTRAVENOUS

## 2024-01-09 MED ORDER — FENTANYL CITRATE (PF) 100 MCG/2ML IJ SOLN
INTRAMUSCULAR | Status: AC
  Filled 2024-01-09: qty 2

## 2024-01-09 MED ORDER — MIDAZOLAM HCL 2 MG/2ML IJ SOLN
INTRAMUSCULAR | Status: DC | PRN
  Administered 2024-01-09: 2 mg via INTRAVENOUS

## 2024-01-09 MED ORDER — PHENYLEPHRINE 80 MCG/ML (10ML) SYRINGE FOR IV PUSH (FOR BLOOD PRESSURE SUPPORT)
PREFILLED_SYRINGE | INTRAVENOUS | Status: AC
  Filled 2024-01-09: qty 10

## 2024-01-09 MED ORDER — ORAL CARE MOUTH RINSE: 15.0000 mL | Freq: Once | OROMUCOSAL | Status: AC

## 2024-01-09 MED ORDER — SODIUM CHLORIDE 0.9 % IV SOLN
1.0000 g | INTRAVENOUS | Status: AC
  Administered 2024-01-09: 1 g via INTRAVENOUS
  Filled 2024-01-09: qty 1000

## 2024-01-09 MED ORDER — PHENAZOPYRIDINE HCL 200 MG PO TABS: 200.0000 mg | ORAL_TABLET | Freq: Three times a day (TID) | ORAL | 0 refills | Status: AC | PRN

## 2024-01-09 MED ORDER — DEXAMETHASONE SODIUM PHOSPHATE 10 MG/ML IJ SOLN
INTRAMUSCULAR | Status: DC | PRN
  Administered 2024-01-09: 8 mg via INTRAVENOUS

## 2024-01-09 MED ORDER — CHLORHEXIDINE GLUCONATE 0.12 % MT SOLN
15.0000 mL | Freq: Once | OROMUCOSAL | Status: AC
  Administered 2024-01-09: 15 mL via OROMUCOSAL

## 2024-01-09 MED ORDER — FENTANYL CITRATE (PF) 100 MCG/2ML IJ SOLN
INTRAMUSCULAR | Status: DC | PRN
  Administered 2024-01-09: 50 ug via INTRAVENOUS

## 2024-01-09 MED ORDER — LACTATED RINGERS IV SOLN: INTRAVENOUS | Status: DC

## 2024-01-09 MED ORDER — LIDOCAINE HCL (PF) 2 % IJ SOLN
INTRAMUSCULAR | Status: AC
  Filled 2024-01-09: qty 5

## 2024-01-09 MED ORDER — TAMSULOSIN HCL 0.4 MG PO CAPS: 0.4000 mg | ORAL_CAPSULE | Freq: Every day | ORAL | 1 refills | Status: AC

## 2024-01-09 MED ORDER — SUCCINYLCHOLINE CHLORIDE 200 MG/10ML IV SOSY
PREFILLED_SYRINGE | INTRAVENOUS | Status: DC | PRN
  Administered 2024-01-09: 100 mg via INTRAVENOUS

## 2024-01-09 MED ORDER — ACETAMINOPHEN 500 MG PO TABS
1000.0000 mg | ORAL_TABLET | Freq: Once | ORAL | Status: AC
  Administered 2024-01-09: 1000 mg via ORAL
  Filled 2024-01-09: qty 2

## 2024-01-09 MED ORDER — LACTATED RINGERS IV SOLN
INTRAVENOUS | Status: DC | PRN
Start: 2024-01-09 — End: 2024-01-09

## 2024-01-09 MED ORDER — 0.9 % SODIUM CHLORIDE (POUR BTL) OPTIME
TOPICAL | Status: DC | PRN
  Administered 2024-01-09: 1000 mL

## 2024-01-09 MED ORDER — HYOSCYAMINE SULFATE 0.125 MG PO TBDP: 0.1250 mg | ORAL_TABLET | Freq: Four times a day (QID) | ORAL | 0 refills | Status: AC | PRN

## 2024-01-09 MED ORDER — ONDANSETRON HCL 4 MG/2ML IJ SOLN
INTRAMUSCULAR | Status: DC | PRN
  Administered 2024-01-09: 4 mg via INTRAVENOUS

## 2024-01-09 MED ORDER — METHOCARBAMOL 750 MG PO TABS: 750.0000 mg | ORAL_TABLET | Freq: Four times a day (QID) | ORAL | 0 refills | Status: AC

## 2024-01-09 MED ORDER — LIDOCAINE HCL (PF) 2 % IJ SOLN
INTRAMUSCULAR | Status: DC | PRN
  Administered 2024-01-09: 100 mg via INTRADERMAL

## 2024-01-09 MED ORDER — SODIUM CHLORIDE 0.9 % IR SOLN
Status: DC | PRN
  Administered 2024-01-09: 3000 mL via INTRAVESICAL

## 2024-01-09 MED ORDER — DEXAMETHASONE SODIUM PHOSPHATE 10 MG/ML IJ SOLN
INTRAMUSCULAR | Status: AC
  Filled 2024-01-09: qty 1

## 2024-01-09 MED ORDER — VASOPRESSIN 20 UNIT/ML IV SOLN
INTRAVENOUS | Status: AC
  Filled 2024-01-09: qty 1

## 2024-01-09 MED ORDER — IOHEXOL 300 MG/ML  SOLN
INTRAMUSCULAR | Status: DC | PRN
Start: 2024-01-09 — End: 2024-01-09
  Administered 2024-01-09: 8 mL

## 2024-01-09 MED ORDER — GENTAMICIN SULFATE 40 MG/ML IJ SOLN
5.0000 mg/kg | INTRAVENOUS | Status: AC
  Administered 2024-01-09: 420 mg via INTRAVENOUS
  Filled 2024-01-09 (×2): qty 10.5

## 2024-01-09 SURGICAL SUPPLY — 22 items
BAG URO CATCHER STRL LF (MISCELLANEOUS) ×1 IMPLANT
BASKET ZERO TIP NITINOL 2.4FR (BASKET) IMPLANT
BULB IRRIG PATHFIND (MISCELLANEOUS) IMPLANT
CATH URETL OPEN 5X70 (CATHETERS) ×1 IMPLANT
CLOTH BEACON ORANGE TIMEOUT ST (SAFETY) ×1 IMPLANT
EXTRACTOR STONE 1.7FRX115CM (UROLOGICAL SUPPLIES) IMPLANT
FIBER LASER MOSES 200 DFL (Laser) IMPLANT
FIBER LASER MOSES 365 DFL (Laser) IMPLANT
GLOVE SURG LX STRL 8.0 MICRO (GLOVE) ×1 IMPLANT
GOWN STRL REUS W/ TWL XL LVL3 (GOWN DISPOSABLE) ×1 IMPLANT
GUIDEWIRE ANG ZIPWIRE 038X150 (WIRE) IMPLANT
GUIDEWIRE STR DUAL SENSOR (WIRE) ×1 IMPLANT
KIT TURNOVER KIT A (KITS) IMPLANT
LASER FIB FLEXIVA PULSE ID 910 (Laser) IMPLANT
MANIFOLD NEPTUNE II (INSTRUMENTS) ×1 IMPLANT
PACK CYSTO (CUSTOM PROCEDURE TRAY) ×1 IMPLANT
SHEATH NAV HD 11/13X46 (SHEATH) IMPLANT
SHEATH NAVIGATOR HD 12/14X28 (SHEATH) IMPLANT
SHEATH NAVIGATOR HD 12/14X36 (SHEATH) IMPLANT
STENT URET 6FRX26 CONTOUR (STENTS) IMPLANT
TUBING CONNECTING 10 (TUBING) ×1 IMPLANT
TUBING UROLOGY SET (TUBING) ×1 IMPLANT

## 2024-01-09 NOTE — Discharge Instructions (Addendum)
 DISCHARGE INSTRUCTIONS FOR Ureteroscopy and/or Ureteral Stent Placement  MEDICATIONS:  1.  Robaxin 2. Tamsulosin  3. Hyoscyamine  4. Pyridium   ACTIVITY:  1. No strenuous activity x 1week  2. No driving while on narcotic pain medications  3. Drink plenty of water  4. Continue to walk at home - it is normal to see blood in the urine while the stent is in place, so keep active, but don't over do it.  5. May return to work/school tomorrow or when you feel ready  6. You may experience some pain when urinating in the kidney on the side that was operated on while the stent is in place this is normal  WHAT IS NORMAL TO EXPERIENCE: It is normal to feel the urge to urinate while the stent is in place It is normal to have blood in your urine while the stent is in place  It sometimes can be normal to have pain in your kidney when you urinate   BATHING:  1. You can shower and we recommend daily showers  2. You have a string coming from your urethra: The stent string is attached to your ureteral stent. Do not pull on this.   DIET: You may return to your normal diet immediately. Because of the raw surface of your bladder, alcohol, spicy foods, acid type foods and drinks with caffeine may cause irritation or frequency and should be used in moderation. To keep your urine flowing freely and to avoid constipation, drink plenty of fluids during the day ( 8-10 glasses ). Tip: Avoid cranberry juice because it is very acidic.  SIGNS/SYMPTOMS TO CALL:  Please call us if you have a fever greater than 101.5, uncontrolled nausea/vomiting, uncontrolled pain, dizziness, unable to urinate, bloody urine with clots greater than the size of a quarter, chest pain, shortness of breath, leg swelling, leg pain, redness around wound, drainage from wound, or any other concerns or questions.   You can reach [place] at [phone].   FOLLOW-UP:  1. You may remove your stent in on Friday 3/28. To do this go into the shower,  grab hold of the tether coming from your urethra. Pull the tether consistent motion until the stent is removed from your body. The stent will be around 10 inches long with a curl on either end.  2. You you have been set up for f/u in 6-8 weeks

## 2024-01-09 NOTE — Anesthesia Postprocedure Evaluation (Signed)
 Anesthesia Post Note  Patient: Austin Lane  Procedure(s) Performed: CYSTOSCOPY WITH LITHOLAPAXY/RETROGRADE PYELOGRAM/URETEROSCOPY/HOLMIUM LASER/STENT PLACEMENT (Right)     Patient location during evaluation: PACU Anesthesia Type: General Level of consciousness: awake Pain management: pain level controlled Vital Signs Assessment: post-procedure vital signs reviewed and stable Respiratory status: spontaneous breathing, nonlabored ventilation and respiratory function stable Cardiovascular status: blood pressure returned to baseline and stable Postop Assessment: no apparent nausea or vomiting Anesthetic complications: no   No notable events documented.  Last Vitals:  Vitals:   01/09/24 1430 01/09/24 1445  BP: (!) 155/96 (!) 147/96  Pulse: 69 70  Resp: 14 12  Temp:    SpO2: 100% 97%    Last Pain:  Vitals:   01/09/24 1445  TempSrc:   PainSc: 0-No pain                 Linton Rump

## 2024-01-09 NOTE — Transfer of Care (Signed)
 Immediate Anesthesia Transfer of Care Note  Patient: Austin Lane  Procedure(s) Performed: CYSTOSCOPY WITH LITHOLAPAXY/RETROGRADE PYELOGRAM/URETEROSCOPY/HOLMIUM LASER/STENT PLACEMENT (Right)  Patient Location: PACU  Anesthesia Type:General  Level of Consciousness: sedated and responds to stimulation  Airway & Oxygen Therapy: Patient Spontanous Breathing  Post-op Assessment: Report given to RN and Post -op Vital signs reviewed and stable  Post vital signs: Reviewed and stable  Last Vitals:  Vitals Value Taken Time  BP    Temp    Pulse    Resp    SpO2      Last Pain:  Vitals:   01/09/24 1038  TempSrc:   PainSc: 0-No pain         Complications: No notable events documented.

## 2024-01-09 NOTE — Anesthesia Preprocedure Evaluation (Addendum)
 Anesthesia Evaluation  Patient identified by MRN, date of birth, ID band Patient awake    Reviewed: Allergy & Precautions, NPO status , Patient's Chart, lab work & pertinent test results  History of Anesthesia Complications Negative for: history of anesthetic complications  Airway Mallampati: III  TM Distance: >3 FB Neck ROM: Full   Comment: Previous grade II view with MAC 4, easy mask Dental  (+) Dental Advisory Given   Pulmonary neg pulmonary ROS   Pulmonary exam normal breath sounds clear to auscultation       Cardiovascular hypertension (olmesartan-HCTZ), Pt. on medications (-) angina (-) Past MI, (-) Cardiac Stents and (-) CABG (-) dysrhythmias  Rhythm:Regular Rate:Normal  HLD   Neuro/Psych  Headaches, neg Seizures PSYCHIATRIC DISORDERS         GI/Hepatic negative GI ROS, Neg liver ROS,,,  Endo/Other  negative endocrine ROS    Renal/GU negative Renal ROS     Musculoskeletal   Abdominal   Peds  Hematology negative hematology ROS (+) Lab Results      Component                Value               Date                      WBC                      8.1                 12/27/2023                HGB                      14.3                12/27/2023                HCT                      40.6                12/27/2023                MCV                      91.6                12/27/2023                PLT                      194                 12/27/2023              Anesthesia Other Findings   Reproductive/Obstetrics                             Anesthesia Physical Anesthesia Plan  ASA: 2  Anesthesia Plan: General   Post-op Pain Management: Tylenol PO (pre-op)*   Induction: Intravenous  PONV Risk Score and Plan: 2 and Ondansetron, Dexamethasone and Treatment may vary due to age or medical condition  Airway Management Planned: LMA  Additional Equipment:   Intra-op Plan:    Post-operative Plan: Extubation in OR  Informed Consent: I have reviewed the patients History and Physical, chart, labs and discussed the procedure including the risks, benefits and alternatives for the proposed anesthesia with the patient or authorized representative who has indicated his/her understanding and acceptance.     Dental advisory given  Plan Discussed with: CRNA and Anesthesiologist  Anesthesia Plan Comments: (Risks of general anesthesia discussed including, but not limited to, sore throat, hoarse voice, chipped/damaged teeth, injury to vocal cords, nausea and vomiting, allergic reactions, lung infection, heart attack, stroke, and death. All questions answered. )        Anesthesia Quick Evaluation

## 2024-01-09 NOTE — Anesthesia Procedure Notes (Signed)
 Procedure Name: Intubation Date/Time: 01/09/2024 12:41 PM  Performed by: Micki Riley, CRNAPre-anesthesia Checklist: Patient identified, Emergency Drugs available, Suction available and Patient being monitored Patient Re-evaluated:Patient Re-evaluated prior to induction Oxygen Delivery Method: Circle System Utilized Preoxygenation: Pre-oxygenation with 100% oxygen Induction Type: IV induction Ventilation: Mask ventilation without difficulty Laryngoscope Size: McGrath and 3 Grade View: Grade II Tube type: Oral Number of attempts: 1 Airway Equipment and Method: Stylet and Oral airway Placement Confirmation: ETT inserted through vocal cords under direct vision, positive ETCO2 and breath sounds checked- equal and bilateral Secured at: 22 cm Tube secured with: Tape Dental Injury: Teeth and Oropharynx as per pre-operative assessment

## 2024-01-09 NOTE — Op Note (Signed)
 Preoperative diagnosis: right ureteral calculus Bladder stone   Postoperative diagnosis: Right ureteral calculus and bladder calculus  Procedure:  Cystoscopy right ureteroscopy and stone removal Ureteroscopic laser lithotripsy right 3F x 26 ureteral stent placement  right retrograde pyelography with interpretation Cystolitholapaxy 1.5 cm stone  Surgeon: Dr. Vilma Prader  Anesthesia: General  Complications: None  Intraoperative findings:  Prostatic urethral stone pushed in the bladder fragmented and extracted Right ureteral stone fragmented until all sizes less than 2 mm in diameter 6 x 26 double-J stent placed in the right ureter with strings  Retrograde pyelogram interpretation: Nondistended right collecting system with 3 major calyces filling defect in the renal pelvis.  No perforation noted  EBL: Minimal  Specimens: None  Disposition of specimens: [facility] for stone analysis  Indication: Austin Lane is a 68 y.o.   patient with a prostatic urethral stone with a catheter placed and right ureteral stone and associated right symptoms. After reviewing the management options for treatment, the patient elected to proceed with the above surgical procedure(s). We have discussed the potential benefits and risks of the procedure, side effects of the proposed treatment, the likelihood of the patient achieving the goals of the procedure, and any potential problems that might occur during the procedure or recuperation. Informed consent has been obtained.   Description of procedure:  The patient was taken to the operating room and general anesthesia was induced.  The patient was placed in the dorsal lithotomy position, prepped and draped in the usual sterile fashion, and preoperative antibiotics were administered. A preoperative time-out was performed.   Cystourethroscopy was performed.  The patient's urethra was examined and was normal.  Patient did have an  obstructive prostate, demonstrated bilobar prostatic hypertrophy.  There is also noted to be a small prostatic urethral stone that was pushed into the bladder.. Pan cystoscopy was performed and the bladder systematically examined in its entirety. There was no evidence for any bladder tumors, or other mucosal pathology.  Bladder stone was noted as above.  First a sensor wire was placed in the right ureteral orifice per placement was confirmed with fluoroscopy.  Then 1000 m laser was placed through the 22 French cystoscope in the bladder the bladder stone was then fragmented using settings of 1 J and 15 Hz.  Once fragments were small enough to be evacuated all were evacuated.  Attention was turned to the right ureter and a 4.5 French semirigid ureteroscope was then advanced into the ureter next to the guidewire and the calculus was identified.   The stone was then fragmented with the 200 micron holmium laser fiber on a setting of 0.6 and frequency of 6 Hz.   During fragmentation the stone is right at the UPJ was pushed into the renal pelvis.  Then a retrograde pyelogram was performed through the scope demonstrating the findings above including dilated pelvis as well as a filling defect within the renal pelvis.  Next a second sensor wire was advanced into the renal pelvis proper placement was confirmed with fluoroscopy.  Then a 45 cm access sheath 11-13 [place] was advanced over one of the wires into the collecting system under fluoroscopy guidance.  The operator was then removed and the dual-lumen flexible ureteroscope was advanced into the renal pelvis all stone fragments were found in fragmented to size less than 2 mm using a 200 m laser on settings of 0.5 J and 15 Hz.  Reinspection of the renal pelvis demonstrated that all fragments were less than  2 mm in size no stones were basketed out.  Pan pyeloscopy confirmed this.  Then the scope was removed as well as the sheath.    Reinspection of the ureter  revealed no remaining visible stones or fragments and no injury to the ureter..   The 6 x 26 double-J stent was then placed over the wire into the right collecting system under fluoroscopic guidance.  Prior placement was confirmed with fluoroscopy in the kidney as well as the bladder.  The scope was then advanced in the bladder and the bladder was filled to 500 cc of the patient could urinate after the procedure.  .  The patient appeared to tolerate the procedure well and without complications.  The patient was able to be awakened and transferred to the recovery unit in satisfactory condition.   Disposition: The tether of the stent was left on and secured to the ventral aspect of the patient's penis..  Instructions for removing the stent have been provided to the patient. The patient has been scheduled for followup in 6 weeks with a renal ultrasound.

## 2024-01-10 ENCOUNTER — Encounter (HOSPITAL_COMMUNITY): Payer: Self-pay | Admitting: Urology

## 2024-01-12 DIAGNOSIS — R8271 Bacteriuria: Secondary | ICD-10-CM | POA: Diagnosis not present

## 2024-01-12 DIAGNOSIS — N200 Calculus of kidney: Secondary | ICD-10-CM | POA: Diagnosis not present

## 2024-01-12 DIAGNOSIS — N3000 Acute cystitis without hematuria: Secondary | ICD-10-CM | POA: Diagnosis not present

## 2024-01-12 DIAGNOSIS — N210 Calculus in bladder: Secondary | ICD-10-CM | POA: Diagnosis not present

## 2024-01-28 DIAGNOSIS — N200 Calculus of kidney: Secondary | ICD-10-CM | POA: Diagnosis not present

## 2024-02-06 DIAGNOSIS — L538 Other specified erythematous conditions: Secondary | ICD-10-CM | POA: Diagnosis not present

## 2024-02-06 DIAGNOSIS — Z09 Encounter for follow-up examination after completed treatment for conditions other than malignant neoplasm: Secondary | ICD-10-CM | POA: Diagnosis not present

## 2024-02-06 DIAGNOSIS — L821 Other seborrheic keratosis: Secondary | ICD-10-CM | POA: Diagnosis not present

## 2024-02-06 DIAGNOSIS — L814 Other melanin hyperpigmentation: Secondary | ICD-10-CM | POA: Diagnosis not present

## 2024-02-06 DIAGNOSIS — L820 Inflamed seborrheic keratosis: Secondary | ICD-10-CM | POA: Diagnosis not present

## 2024-02-06 DIAGNOSIS — D225 Melanocytic nevi of trunk: Secondary | ICD-10-CM | POA: Diagnosis not present

## 2024-02-06 DIAGNOSIS — L570 Actinic keratosis: Secondary | ICD-10-CM | POA: Diagnosis not present

## 2024-02-08 ENCOUNTER — Encounter: Payer: Self-pay | Admitting: Family Medicine

## 2024-02-08 ENCOUNTER — Other Ambulatory Visit: Payer: Self-pay | Admitting: Family Medicine

## 2024-02-08 DIAGNOSIS — M7989 Other specified soft tissue disorders: Secondary | ICD-10-CM | POA: Diagnosis not present

## 2024-02-08 DIAGNOSIS — R051 Acute cough: Secondary | ICD-10-CM | POA: Diagnosis not present

## 2024-02-08 DIAGNOSIS — R509 Fever, unspecified: Secondary | ICD-10-CM | POA: Diagnosis not present

## 2024-02-08 DIAGNOSIS — J22 Unspecified acute lower respiratory infection: Secondary | ICD-10-CM | POA: Diagnosis not present

## 2024-02-09 ENCOUNTER — Ambulatory Visit
Admission: RE | Admit: 2024-02-09 | Discharge: 2024-02-09 | Disposition: A | Discharge status: Home / Self Care | Source: Ambulatory Visit | Attending: Family Medicine | Admitting: Family Medicine

## 2024-02-09 DIAGNOSIS — M7989 Other specified soft tissue disorders: Secondary | ICD-10-CM

## 2024-02-09 DIAGNOSIS — I8002 Phlebitis and thrombophlebitis of superficial vessels of left lower extremity: Secondary | ICD-10-CM | POA: Diagnosis not present

## 2024-02-27 DIAGNOSIS — N210 Calculus in bladder: Secondary | ICD-10-CM | POA: Diagnosis not present

## 2024-02-27 DIAGNOSIS — R3121 Asymptomatic microscopic hematuria: Secondary | ICD-10-CM | POA: Diagnosis not present

## 2024-02-27 DIAGNOSIS — N200 Calculus of kidney: Secondary | ICD-10-CM | POA: Diagnosis not present

## 2024-03-27 DIAGNOSIS — M25562 Pain in left knee: Secondary | ICD-10-CM | POA: Diagnosis not present

## 2024-03-27 DIAGNOSIS — M25561 Pain in right knee: Secondary | ICD-10-CM | POA: Diagnosis not present

## 2024-03-27 DIAGNOSIS — M25461 Effusion, right knee: Secondary | ICD-10-CM | POA: Diagnosis not present

## 2024-06-21 DIAGNOSIS — E785 Hyperlipidemia, unspecified: Secondary | ICD-10-CM | POA: Diagnosis not present

## 2024-06-21 DIAGNOSIS — Z125 Encounter for screening for malignant neoplasm of prostate: Secondary | ICD-10-CM | POA: Diagnosis not present

## 2024-06-21 DIAGNOSIS — Z87442 Personal history of urinary calculi: Secondary | ICD-10-CM | POA: Diagnosis not present

## 2024-06-21 DIAGNOSIS — Z23 Encounter for immunization: Secondary | ICD-10-CM | POA: Diagnosis not present

## 2024-06-21 DIAGNOSIS — R7309 Other abnormal glucose: Secondary | ICD-10-CM | POA: Diagnosis not present

## 2024-06-21 DIAGNOSIS — Z0000 Encounter for general adult medical examination without abnormal findings: Secondary | ICD-10-CM | POA: Diagnosis not present

## 2024-06-21 DIAGNOSIS — I10 Essential (primary) hypertension: Secondary | ICD-10-CM | POA: Diagnosis not present

## 2024-07-29 DIAGNOSIS — R972 Elevated prostate specific antigen [PSA]: Secondary | ICD-10-CM | POA: Diagnosis not present

## 2024-07-31 DIAGNOSIS — Z23 Encounter for immunization: Secondary | ICD-10-CM | POA: Diagnosis not present

## 2024-08-09 DIAGNOSIS — H5213 Myopia, bilateral: Secondary | ICD-10-CM | POA: Diagnosis not present

## 2024-08-09 DIAGNOSIS — H2513 Age-related nuclear cataract, bilateral: Secondary | ICD-10-CM | POA: Diagnosis not present

## 2024-08-14 DIAGNOSIS — Z0100 Encounter for examination of eyes and vision without abnormal findings: Secondary | ICD-10-CM | POA: Diagnosis not present

## 2024-08-26 DIAGNOSIS — N400 Enlarged prostate without lower urinary tract symptoms: Secondary | ICD-10-CM | POA: Diagnosis not present

## 2024-08-26 DIAGNOSIS — N401 Enlarged prostate with lower urinary tract symptoms: Secondary | ICD-10-CM | POA: Diagnosis not present

## 2024-08-26 DIAGNOSIS — R3914 Feeling of incomplete bladder emptying: Secondary | ICD-10-CM | POA: Diagnosis not present

## 2024-08-26 DIAGNOSIS — R399 Unspecified symptoms and signs involving the genitourinary system: Secondary | ICD-10-CM | POA: Diagnosis not present

## 2024-08-26 DIAGNOSIS — N209 Urinary calculus, unspecified: Secondary | ICD-10-CM | POA: Diagnosis not present

## 2024-08-26 DIAGNOSIS — R350 Frequency of micturition: Secondary | ICD-10-CM | POA: Diagnosis not present

## 2024-08-26 DIAGNOSIS — R3912 Poor urinary stream: Secondary | ICD-10-CM | POA: Diagnosis not present

## 2024-08-26 DIAGNOSIS — R351 Nocturia: Secondary | ICD-10-CM | POA: Diagnosis not present

## 2024-08-26 DIAGNOSIS — R972 Elevated prostate specific antigen [PSA]: Secondary | ICD-10-CM | POA: Diagnosis not present

## 2024-09-19 DIAGNOSIS — D1801 Hemangioma of skin and subcutaneous tissue: Secondary | ICD-10-CM | POA: Diagnosis not present

## 2024-09-19 DIAGNOSIS — L578 Other skin changes due to chronic exposure to nonionizing radiation: Secondary | ICD-10-CM | POA: Diagnosis not present

## 2024-09-19 DIAGNOSIS — L821 Other seborrheic keratosis: Secondary | ICD-10-CM | POA: Diagnosis not present

## 2024-09-19 DIAGNOSIS — L538 Other specified erythematous conditions: Secondary | ICD-10-CM | POA: Diagnosis not present

## 2024-09-19 DIAGNOSIS — L814 Other melanin hyperpigmentation: Secondary | ICD-10-CM | POA: Diagnosis not present

## 2024-09-19 DIAGNOSIS — L570 Actinic keratosis: Secondary | ICD-10-CM | POA: Diagnosis not present

## 2024-09-19 DIAGNOSIS — D485 Neoplasm of uncertain behavior of skin: Secondary | ICD-10-CM | POA: Diagnosis not present

## 2024-09-20 DIAGNOSIS — R3121 Asymptomatic microscopic hematuria: Secondary | ICD-10-CM | POA: Diagnosis not present

## 2024-10-01 DIAGNOSIS — G43019 Migraine without aura, intractable, without status migrainosus: Secondary | ICD-10-CM | POA: Diagnosis not present
